# Patient Record
Sex: Female | Born: 1950 | Race: White | Hispanic: No | Smoking: Never smoker
Health system: Southern US, Community
[De-identification: ages and names within clinical notes are randomized; demographics above are authoritative.]

## PROBLEM LIST (undated history)

## (undated) DIAGNOSIS — E7800 Pure hypercholesterolemia, unspecified: Secondary | ICD-10-CM

## (undated) DIAGNOSIS — D122 Benign neoplasm of ascending colon: Secondary | ICD-10-CM

## (undated) DIAGNOSIS — M5442 Lumbago with sciatica, left side: Secondary | ICD-10-CM

## (undated) DIAGNOSIS — T8859XA Other complications of anesthesia, initial encounter: Secondary | ICD-10-CM

## (undated) DIAGNOSIS — Z8489 Family history of other specified conditions: Secondary | ICD-10-CM

## (undated) DIAGNOSIS — E785 Hyperlipidemia, unspecified: Secondary | ICD-10-CM

## (undated) DIAGNOSIS — M5136 Other intervertebral disc degeneration, lumbar region: Secondary | ICD-10-CM

## (undated) DIAGNOSIS — I10 Essential (primary) hypertension: Secondary | ICD-10-CM

## (undated) DIAGNOSIS — N8110 Cystocele, unspecified: Secondary | ICD-10-CM

## (undated) DIAGNOSIS — K8020 Calculus of gallbladder without cholecystitis without obstruction: Secondary | ICD-10-CM

## (undated) DIAGNOSIS — C801 Malignant (primary) neoplasm, unspecified: Secondary | ICD-10-CM

## (undated) DIAGNOSIS — N1831 Chronic kidney disease, stage 3a: Secondary | ICD-10-CM

## (undated) DIAGNOSIS — M51369 Other intervertebral disc degeneration, lumbar region without mention of lumbar back pain or lower extremity pain: Secondary | ICD-10-CM

## (undated) DIAGNOSIS — F439 Reaction to severe stress, unspecified: Secondary | ICD-10-CM

## (undated) DIAGNOSIS — N393 Stress incontinence (female) (male): Secondary | ICD-10-CM

## (undated) DIAGNOSIS — M1990 Unspecified osteoarthritis, unspecified site: Secondary | ICD-10-CM

## (undated) DIAGNOSIS — K5730 Diverticulosis of large intestine without perforation or abscess without bleeding: Secondary | ICD-10-CM

## (undated) DIAGNOSIS — D649 Anemia, unspecified: Secondary | ICD-10-CM

## (undated) DIAGNOSIS — M1612 Unilateral primary osteoarthritis, left hip: Secondary | ICD-10-CM

## (undated) DIAGNOSIS — M810 Age-related osteoporosis without current pathological fracture: Secondary | ICD-10-CM

## (undated) DIAGNOSIS — Z8719 Personal history of other diseases of the digestive system: Secondary | ICD-10-CM

## (undated) DIAGNOSIS — K219 Gastro-esophageal reflux disease without esophagitis: Secondary | ICD-10-CM

## (undated) DIAGNOSIS — K2270 Barrett's esophagus without dysplasia: Secondary | ICD-10-CM

## (undated) HISTORY — PX: CYSTOURETHROSCOPY: SHX476

## (undated) HISTORY — PX: TUBAL LIGATION: SHX77

## (undated) HISTORY — PX: OTHER SURGICAL HISTORY: SHX169

## (undated) HISTORY — PX: COLONOSCOPY: SHX174

## (undated) HISTORY — PX: JOINT REPLACEMENT: SHX530

## (undated) HISTORY — PX: VAGINAL HYSTERECTOMY: SUR661

---

## 2005-06-25 HISTORY — PX: BREAST SURGERY: SHX581

## 2007-06-26 DIAGNOSIS — C801 Malignant (primary) neoplasm, unspecified: Secondary | ICD-10-CM

## 2007-06-26 HISTORY — PX: MASTECTOMY: SHX3

## 2007-06-26 HISTORY — DX: Malignant (primary) neoplasm, unspecified: C80.1

## 2008-11-17 ENCOUNTER — Ambulatory Visit: Payer: Self-pay | Admitting: Internal Medicine

## 2011-06-25 ENCOUNTER — Ambulatory Visit: Payer: Self-pay | Admitting: Internal Medicine

## 2011-07-10 ENCOUNTER — Ambulatory Visit: Payer: Self-pay | Admitting: Internal Medicine

## 2011-07-10 LAB — CBC CANCER CENTER
Basophil %: 0.6 %
Lymphocyte %: 14 %
MCHC: 33.8 g/dL (ref 32.0–36.0)
Monocyte %: 5.7 %
Neutrophil #: 3.6 x10 3/mm (ref 1.4–6.5)
Neutrophil %: 78.5 %
Platelet: 215 x10 3/mm (ref 150–440)
RDW: 11.6 % (ref 11.5–14.5)
WBC: 4.7 x10 3/mm (ref 3.6–11.0)

## 2011-07-10 LAB — BILIRUBIN, TOTAL: Bilirubin,Total: 1.2 mg/dL — ABNORMAL HIGH (ref 0.2–1.0)

## 2011-07-10 LAB — LACTATE DEHYDROGENASE: LDH: 212 U/L (ref 84–246)

## 2011-07-10 LAB — BILIRUBIN, DIRECT: Bilirubin, Direct: 0.2 mg/dL (ref 0.00–0.20)

## 2011-07-11 LAB — IRON AND TIBC
Iron Bind.Cap.(Total): 332 ug/dL (ref 250–450)
Iron Saturation: 27 %
Iron: 89 ug/dL (ref 50–170)
Unbound Iron-Bind.Cap.: 243 ug/dL

## 2011-07-11 LAB — RETICULOCYTES
Absolute Retic Count: 0.051 10*6/uL (ref 0.024–0.084)
Reticulocyte: 1.3 % (ref 0.5–1.5)

## 2011-07-27 ENCOUNTER — Ambulatory Visit: Payer: Self-pay | Admitting: Internal Medicine

## 2011-07-31 LAB — LACTATE DEHYDROGENASE: LDH: 197 U/L (ref 84–246)

## 2011-07-31 LAB — OCCULT BLOOD X 1 CARD TO LAB, STOOL
Occult Blood, Feces: NEGATIVE
Occult Blood, Feces: NEGATIVE

## 2011-07-31 LAB — BILIRUBIN, DIRECT: Bilirubin, Direct: 0.1 mg/dL (ref 0.00–0.20)

## 2011-07-31 LAB — RETICULOCYTES
Absolute Retic Count: 0.13 10*6/uL — ABNORMAL HIGH (ref 0.024–0.084)
Reticulocyte: 3.4 % — ABNORMAL HIGH (ref 0.5–1.5)

## 2011-08-14 LAB — HEPATIC FUNCTION PANEL A (ARMC)
Albumin: 4.2 g/dL (ref 3.4–5.0)
Alkaline Phosphatase: 107 U/L (ref 50–136)
Bilirubin,Total: 1.1 mg/dL — ABNORMAL HIGH (ref 0.2–1.0)
SGOT(AST): 25 U/L (ref 15–37)
SGPT (ALT): 33 U/L

## 2011-08-14 LAB — IRON AND TIBC
Iron Bind.Cap.(Total): 332 ug/dL (ref 250–450)
Iron: 100 ug/dL (ref 50–170)
Unbound Iron-Bind.Cap.: 232 ug/dL

## 2011-08-14 LAB — RETICULOCYTES
Absolute Retic Count: 0.087 10*6/uL — ABNORMAL HIGH (ref 0.024–0.084)
Reticulocyte: 2.3 % — ABNORMAL HIGH (ref 0.5–1.5)

## 2011-08-14 LAB — CANCER CENTER HEMOGLOBIN: HGB: 11.6 g/dL — ABNORMAL LOW (ref 12.0–16.0)

## 2011-08-15 LAB — URINE IEP, RANDOM

## 2011-08-24 ENCOUNTER — Ambulatory Visit: Payer: Self-pay | Admitting: Internal Medicine

## 2011-08-28 LAB — RETICULOCYTES
Absolute Retic Count: 0.139 10*6/uL — ABNORMAL HIGH (ref 0.024–0.084)
Reticulocyte: 3.4 % — ABNORMAL HIGH (ref 0.5–1.5)

## 2011-08-28 LAB — BILIRUBIN, DIRECT: Bilirubin, Direct: 0.2 mg/dL (ref 0.00–0.20)

## 2011-09-11 LAB — CBC CANCER CENTER
Basophil #: 0 x10 3/mm (ref 0.0–0.1)
Basophil %: 0.9 %
Eosinophil #: 0.1 x10 3/mm (ref 0.0–0.7)
HGB: 12.1 g/dL (ref 12.0–16.0)
MCH: 30.8 pg (ref 26.0–34.0)
MCHC: 32.9 g/dL (ref 32.0–36.0)
MCV: 93.7 fL (ref 80–100)
Monocyte #: 0.3 x10 3/mm (ref 0.0–0.7)
Monocyte %: 7.1 %
Neutrophil #: 2.9 x10 3/mm (ref 1.4–6.5)
Neutrophil %: 69.6 %
RBC: 3.93 10*6/uL (ref 3.80–5.20)

## 2011-09-24 ENCOUNTER — Ambulatory Visit: Payer: Self-pay | Admitting: Internal Medicine

## 2011-10-22 LAB — CBC CANCER CENTER
Eosinophil #: 0.1 x10 3/mm (ref 0.0–0.7)
Eosinophil %: 2.6 %
HGB: 12.3 g/dL (ref 12.0–16.0)
Lymphocyte #: 0.9 x10 3/mm — ABNORMAL LOW (ref 1.0–3.6)
Monocyte #: 0.3 x10 3/mm (ref 0.2–0.9)
Neutrophil %: 73.2 %
Platelet: 193 x10 3/mm (ref 150–440)
WBC: 5.2 x10 3/mm (ref 3.6–11.0)

## 2011-10-22 LAB — IRON AND TIBC
Iron Bind.Cap.(Total): 339 ug/dL (ref 250–450)
Iron Saturation: 29 %
Iron: 97 ug/dL (ref 50–170)
Unbound Iron-Bind.Cap.: 242 ug/dL

## 2011-10-22 LAB — FERRITIN: Ferritin (ARMC): 58 ng/mL (ref 8–388)

## 2011-10-24 ENCOUNTER — Ambulatory Visit: Payer: Self-pay | Admitting: Internal Medicine

## 2011-10-29 LAB — OCCULT BLOOD X 1 CARD TO LAB, STOOL
Occult Blood, Feces: NEGATIVE
Occult Blood, Feces: NEGATIVE

## 2011-11-24 ENCOUNTER — Ambulatory Visit: Payer: Self-pay | Admitting: Internal Medicine

## 2011-12-04 ENCOUNTER — Ambulatory Visit: Payer: Self-pay | Admitting: Internal Medicine

## 2011-12-04 LAB — CBC CANCER CENTER
Eosinophil #: 0.1 x10 3/mm (ref 0.0–0.7)
HCT: 36.3 % (ref 35.0–47.0)
HGB: 12.1 g/dL (ref 12.0–16.0)
Lymphocyte %: 20.5 %
MCH: 31.6 pg (ref 26.0–34.0)
MCHC: 33.3 g/dL (ref 32.0–36.0)
MCV: 95 fL (ref 80–100)
Monocyte #: 0.3 x10 3/mm (ref 0.2–0.9)
Monocyte %: 7.8 %
Platelet: 201 x10 3/mm (ref 150–440)

## 2011-12-24 ENCOUNTER — Ambulatory Visit: Payer: Self-pay | Admitting: Internal Medicine

## 2012-06-27 ENCOUNTER — Ambulatory Visit: Payer: Self-pay | Admitting: Internal Medicine

## 2012-07-08 LAB — CBC CANCER CENTER
Basophil %: 1.2 %
Eosinophil #: 0.1 x10 3/mm (ref 0.0–0.7)
Eosinophil %: 2.1 %
HCT: 35.3 % (ref 35.0–47.0)
Lymphocyte #: 0.9 x10 3/mm — ABNORMAL LOW (ref 1.0–3.6)
Lymphocyte %: 20.8 %
MCHC: 33.6 g/dL (ref 32.0–36.0)
MCV: 92 fL (ref 80–100)
Monocyte #: 0.3 x10 3/mm (ref 0.2–0.9)
Monocyte %: 7 %
Neutrophil #: 2.9 x10 3/mm (ref 1.4–6.5)
Platelet: 184 x10 3/mm (ref 150–440)
RBC: 3.83 10*6/uL (ref 3.80–5.20)
RDW: 11.4 % — ABNORMAL LOW (ref 11.5–14.5)

## 2012-07-08 LAB — OCCULT BLOOD X 1 CARD TO LAB, STOOL
Occult Blood, Feces: NEGATIVE
Occult Blood, Feces: NEGATIVE
Occult Blood, Feces: NEGATIVE

## 2012-07-08 LAB — IRON AND TIBC
Iron Bind.Cap.(Total): 330 ug/dL (ref 250–450)
Iron Saturation: 34 %
Unbound Iron-Bind.Cap.: 219 ug/dL

## 2012-07-26 ENCOUNTER — Ambulatory Visit: Payer: Self-pay | Admitting: Internal Medicine

## 2013-12-04 DIAGNOSIS — M129 Arthropathy, unspecified: Secondary | ICD-10-CM | POA: Insufficient documentation

## 2013-12-04 DIAGNOSIS — D649 Anemia, unspecified: Secondary | ICD-10-CM | POA: Insufficient documentation

## 2013-12-04 DIAGNOSIS — E785 Hyperlipidemia, unspecified: Secondary | ICD-10-CM | POA: Insufficient documentation

## 2014-01-04 IMAGING — US ABDOMEN ULTRASOUND
1 series · 17 of 25 positions shown · non-contrast
Comparison: none

REASON FOR EXAM: Anemia
COMMENTS:

[Series 1: abdomen ultrasound · 17 of 84 slices shown]
[im 1/84]
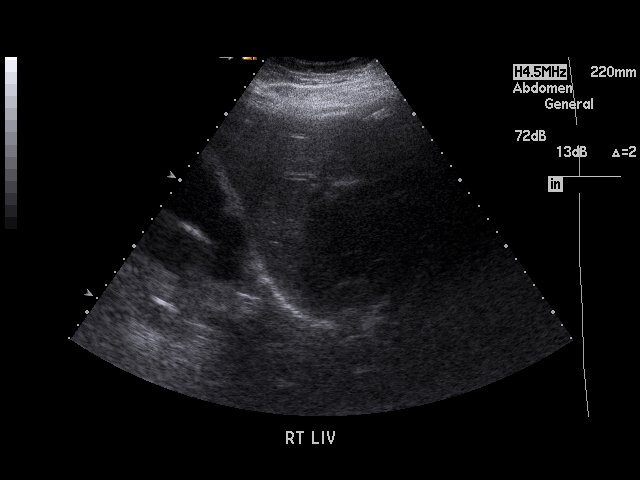
[im 7/84]
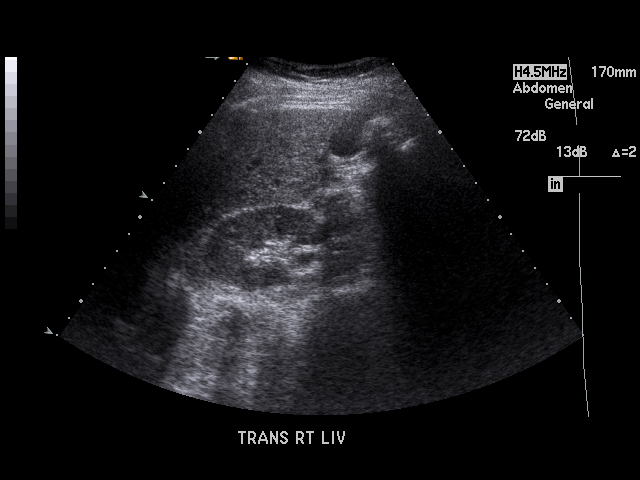
[im 11/84]
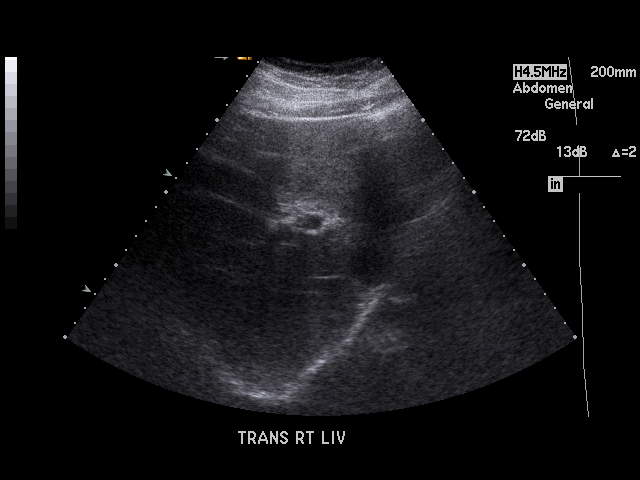
[im 18/84]
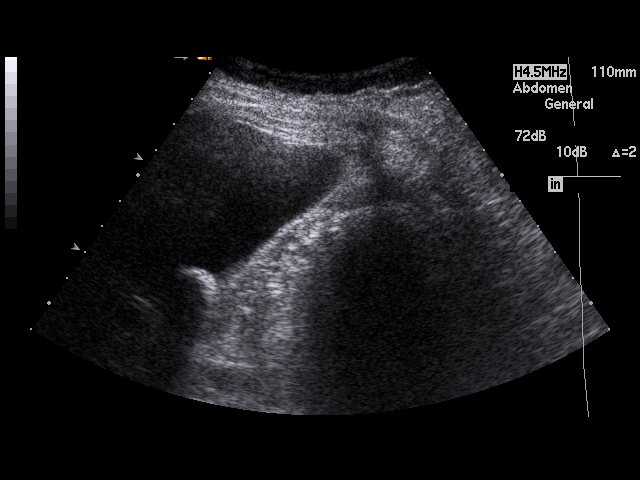
[im 21/84]
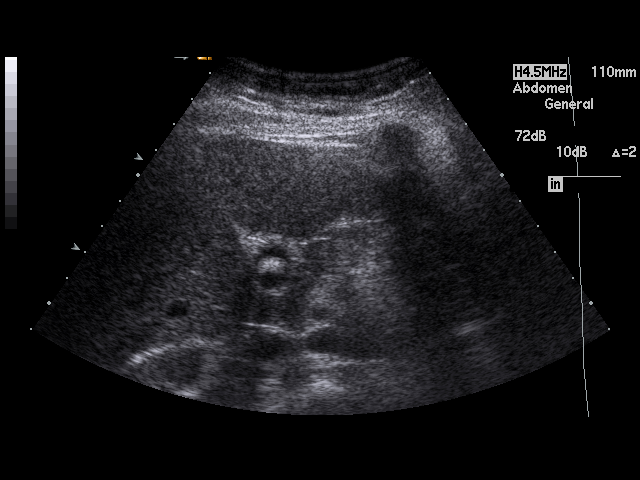
[im 28/84]
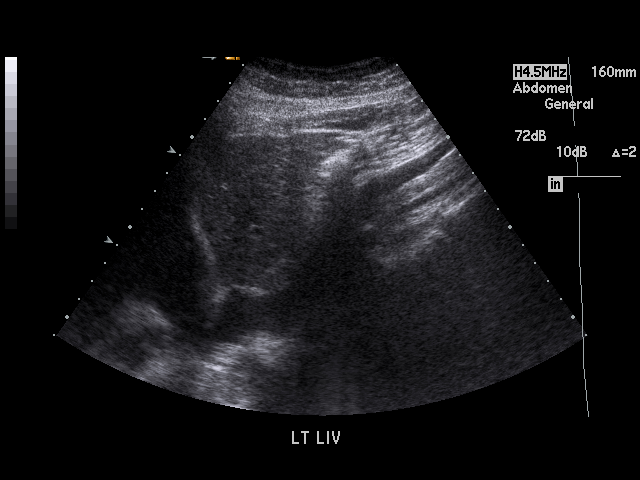
[im 32/84]
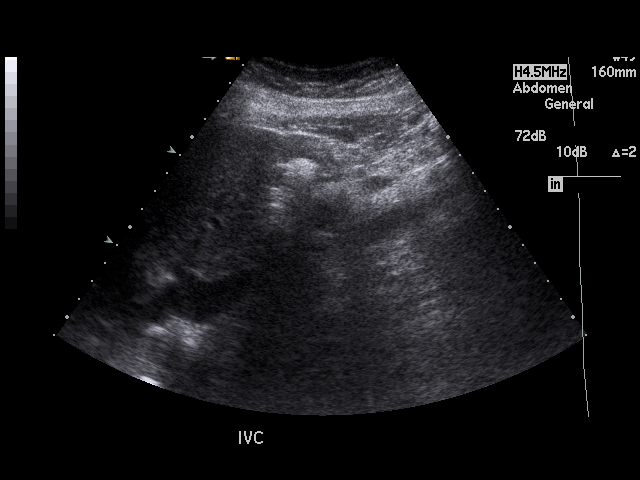
[im 39/84]
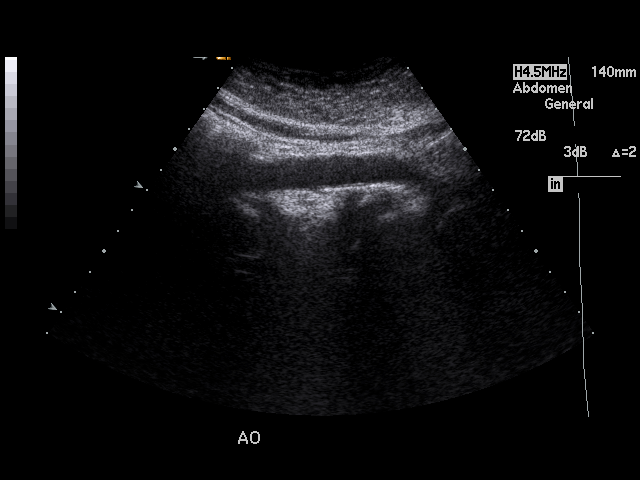
[im 42/84]
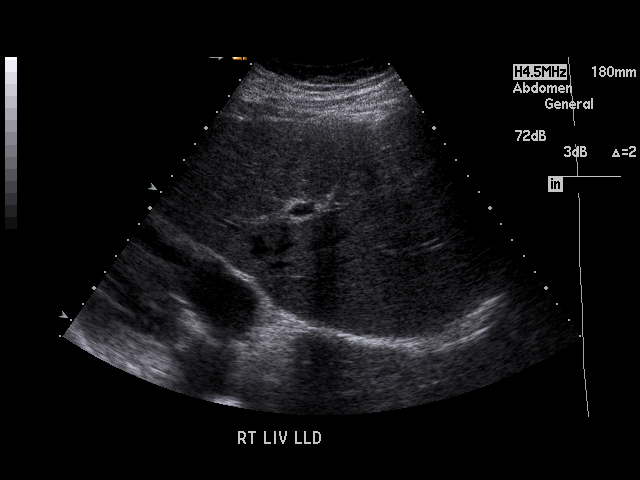
[im 45/84]
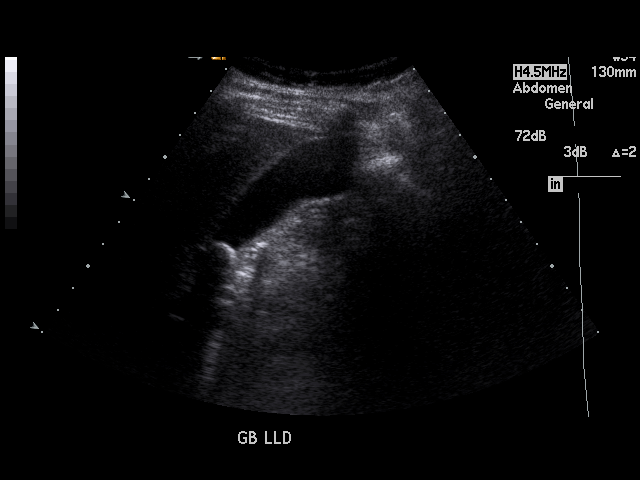
[im 52/84]
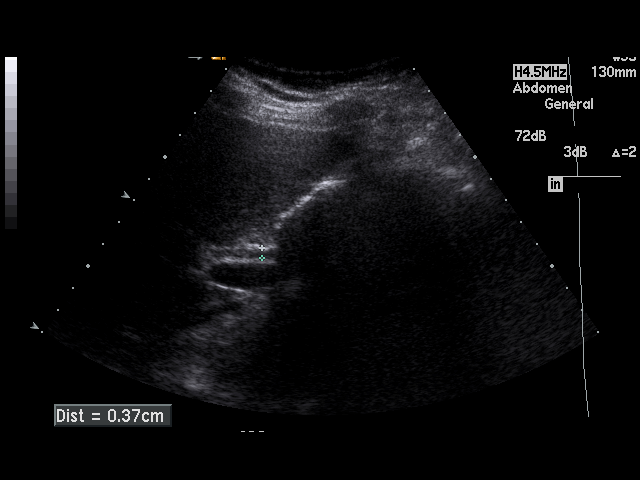
[im 56/84]
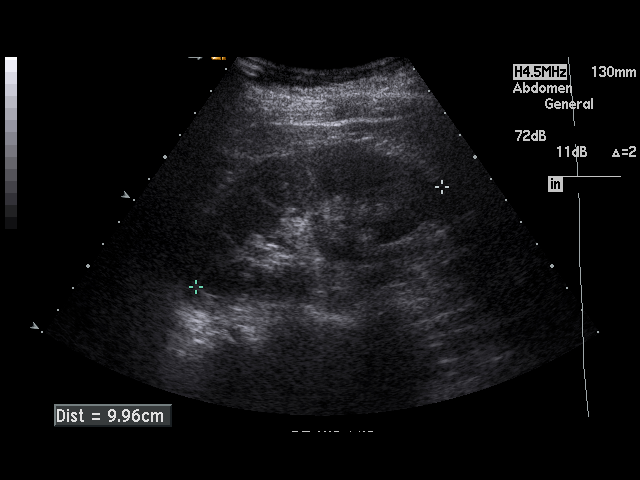
[im 63/84]
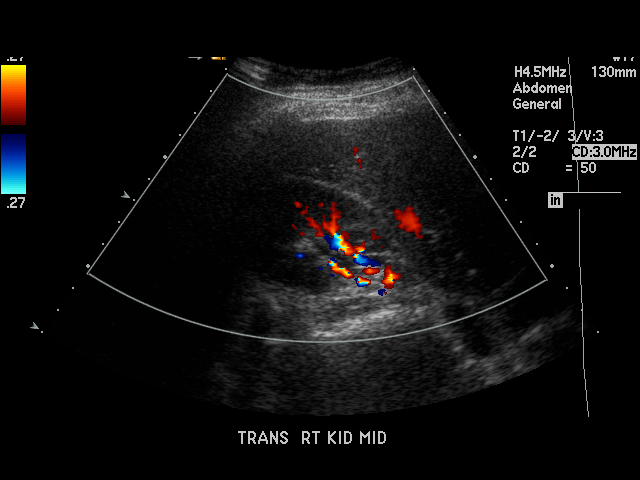
[im 66/84]
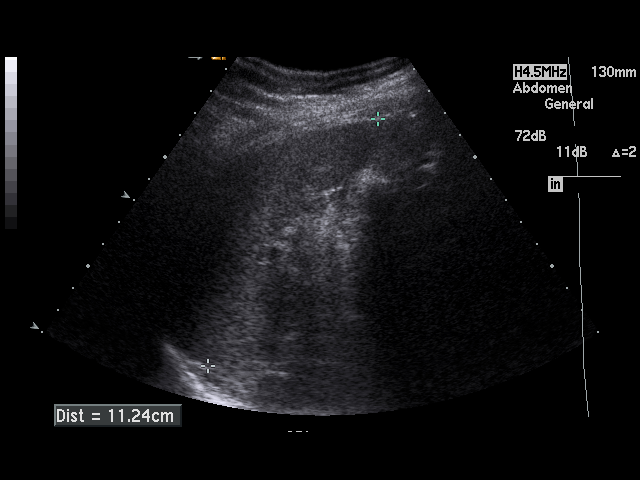
[im 73/84]
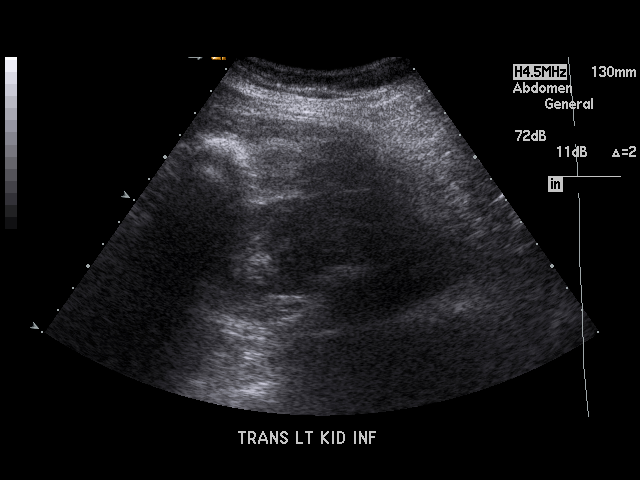
[im 77/84]
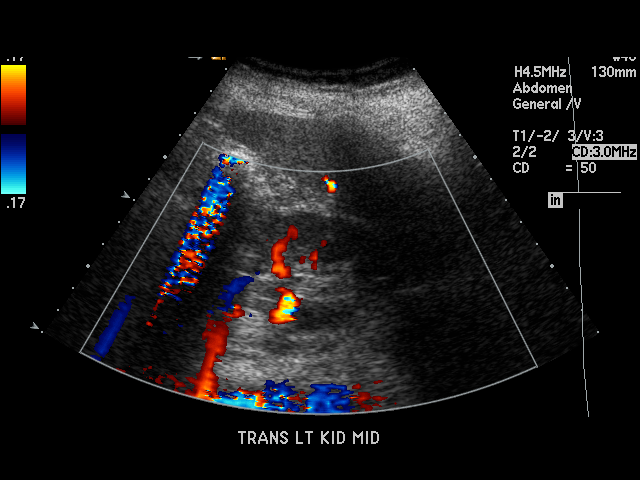
[im 84/84]
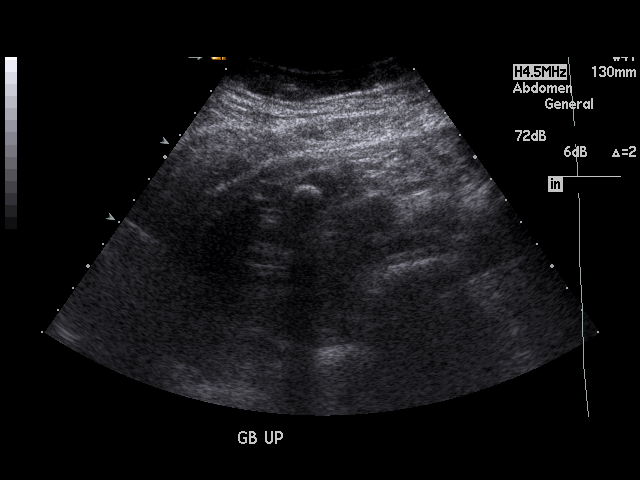

[17 of 25 positions shown; findings below may reference images not displayed]

PROCEDURE:     PADAM - PADAM ABDOMEN GENERAL SURVEY  - August 28, 2011  [DATE]

RESULT:     Abdominal sonogram is performed. Stones are present within the
gallbladder but appear to be mobile with changes in patient position. No
sonographic Murphy's sign is present. The gallbladder wall thickness is
normal at 1.9 mm. The common bile duct diameter is 3.7 mm. The hepatic
echotexture is normal. The liver is normal in size. No intrahepatic or
extrahepatic biliary ductal dilation is seen. The visualized pancreas is
unremarkable. The aorta, proximal inferior vena cava, kidneys and spleen
appear unremarkable.

Right kidney length is 9.96 cm. Left kidney length is 10.61 cm.
IMPRESSION: 1. Cholelithiasis.
2. No evidence of acute cholecystitis. No other significant abnormality
evident within the abdomen.

## 2017-07-09 ENCOUNTER — Emergency Department: Payer: Medicare Other

## 2017-07-09 ENCOUNTER — Emergency Department
Admission: EM | Admit: 2017-07-09 | Discharge: 2017-07-09 | Disposition: A | Discharge status: Home / Self Care | Payer: Medicare Other | Attending: Emergency Medicine | Admitting: Emergency Medicine

## 2017-07-09 DIAGNOSIS — K8020 Calculus of gallbladder without cholecystitis without obstruction: Secondary | ICD-10-CM | POA: Insufficient documentation

## 2017-07-09 DIAGNOSIS — N390 Urinary tract infection, site not specified: Secondary | ICD-10-CM | POA: Diagnosis not present

## 2017-07-09 DIAGNOSIS — K269 Duodenal ulcer, unspecified as acute or chronic, without hemorrhage or perforation: Secondary | ICD-10-CM | POA: Insufficient documentation

## 2017-07-09 DIAGNOSIS — I10 Essential (primary) hypertension: Secondary | ICD-10-CM | POA: Diagnosis not present

## 2017-07-09 DIAGNOSIS — R1012 Left upper quadrant pain: Secondary | ICD-10-CM | POA: Diagnosis present

## 2017-07-09 HISTORY — DX: Malignant (primary) neoplasm, unspecified: C80.1

## 2017-07-09 HISTORY — DX: Essential (primary) hypertension: I10

## 2017-07-09 LAB — BASIC METABOLIC PANEL
Anion gap: 11 (ref 5–15)
BUN: 34 mg/dL — ABNORMAL HIGH (ref 6–20)
CO2: 25 mmol/L (ref 22–32)
Calcium: 10 mg/dL (ref 8.9–10.3)
Chloride: 104 mmol/L (ref 101–111)
Creatinine, Ser: 1.25 mg/dL — ABNORMAL HIGH (ref 0.44–1.00)
GFR calc non Af Amer: 44 mL/min — ABNORMAL LOW (ref >60)
GFR, EST AFRICAN AMERICAN: 51 mL/min — AB (ref >60)
Glucose, Bld: 117 mg/dL — ABNORMAL HIGH (ref 65–99)
Potassium: 4.2 mmol/L (ref 3.5–5.1)
Sodium: 140 mmol/L (ref 135–145)

## 2017-07-09 LAB — URINALYSIS, COMPLETE (UACMP) WITH MICROSCOPIC
Bacteria, UA: NONE SEEN
GLUCOSE, UA: NEGATIVE mg/dL
Ketones, ur: 5 mg/dL — AB
NITRITE: NEGATIVE
PH: 5 (ref 5.0–8.0)
Protein, ur: 30 mg/dL — AB
SPECIFIC GRAVITY, URINE: 1.031 — AB (ref 1.005–1.030)

## 2017-07-09 LAB — CBC
HEMATOCRIT: 38.8 % (ref 35.0–47.0)
HEMOGLOBIN: 12.9 g/dL (ref 12.0–16.0)
MCH: 31.4 pg (ref 26.0–34.0)
MCHC: 33.3 g/dL (ref 32.0–36.0)
MCV: 94.4 fL (ref 80.0–100.0)
Platelets: 277 10*3/uL (ref 150–440)
RBC: 4.11 MIL/uL (ref 3.80–5.20)
RDW: 12 % (ref 11.5–14.5)
WBC: 12.1 10*3/uL — AB (ref 3.6–11.0)

## 2017-07-09 LAB — HEPATIC FUNCTION PANEL
ALK PHOS: 90 U/L (ref 38–126)
ALT: 24 U/L (ref 14–54)
AST: 32 U/L (ref 15–41)
Albumin: 4.6 g/dL (ref 3.5–5.0)
BILIRUBIN INDIRECT: 1 mg/dL — AB (ref 0.3–0.9)
BILIRUBIN TOTAL: 1.2 mg/dL (ref 0.3–1.2)
Bilirubin, Direct: 0.2 mg/dL (ref 0.1–0.5)
Total Protein: 7.8 g/dL (ref 6.5–8.1)

## 2017-07-09 LAB — TROPONIN I: Troponin I: <0.03 ng/mL (ref <0.03)

## 2017-07-09 LAB — LIPASE, BLOOD: LIPASE: 46 U/L (ref 11–51)

## 2017-07-09 MED ORDER — PANTOPRAZOLE SODIUM 40 MG PO TBEC: 40.0000 mg | DELAYED_RELEASE_TABLET | Freq: Every day | ORAL | 0 refills | Status: DC

## 2017-07-09 MED ORDER — PANTOPRAZOLE SODIUM 40 MG PO TBEC
40.0000 mg | DELAYED_RELEASE_TABLET | Freq: Once | ORAL | Status: AC
  Administered 2017-07-09: 40 mg via ORAL
  Filled 2017-07-09: qty 1

## 2017-07-09 MED ORDER — IOPAMIDOL (ISOVUE-300) INJECTION 61%
30.0000 mL | Freq: Once | INTRAVENOUS | Status: AC | PRN
  Administered 2017-07-09: 30 mL via ORAL

## 2017-07-09 MED ORDER — CEPHALEXIN 500 MG PO CAPS
500.0000 mg | ORAL_CAPSULE | Freq: Once | ORAL | Status: AC
  Administered 2017-07-09: 500 mg via ORAL
  Filled 2017-07-09: qty 1

## 2017-07-09 MED ORDER — SODIUM CHLORIDE 0.9 % IV BOLUS (SEPSIS)
1000.0000 mL | Freq: Once | INTRAVENOUS | Status: AC
  Administered 2017-07-09: 1000 mL via INTRAVENOUS

## 2017-07-09 MED ORDER — IOPAMIDOL (ISOVUE-300) INJECTION 61%
75.0000 mL | Freq: Once | INTRAVENOUS | Status: AC | PRN
  Administered 2017-07-09: 75 mL via INTRAVENOUS

## 2017-07-09 MED ORDER — ONDANSETRON 4 MG PO TBDP
4.0000 mg | ORAL_TABLET | Freq: Once | ORAL | Status: AC
  Administered 2017-07-09: 4 mg via ORAL
  Filled 2017-07-09: qty 1

## 2017-07-09 MED ORDER — CEPHALEXIN 500 MG PO CAPS: 500.0000 mg | ORAL_CAPSULE | Freq: Three times a day (TID) | ORAL | 0 refills | Status: AC

## 2017-07-09 NOTE — ED Provider Notes (Signed)
[facility] Emergency Department Provider Note  ____________________________________________   First MD Initiated Contact with Patient 07/09/17 [phone]     (approximate)  I have reviewed the triage vital signs and the nursing notes.   HISTORY  Chief Complaint Emesis and Abdominal Pain   HPI Debbie Chavez is a 67 y.o. female with a history of hypertension as well as breast cancer who is presenting to the emergency department today with diffuse abdominal pain.  Says the pain is a 4 out of 10 right now but when it started at about midnight it was an 8 to a 10 out of 10.  She says that it feels like a pressure and cramping but without any radiation.  Denies any burning with urination, urinary frequency or vaginal bleeding or discharge.  She says it is been associated with multiple episodes of vomiting which turned bilious towards the morning.  She says that she has a history of gallstones which were found incidentally during ultrasound remotely.  Denies any exacerbating or alleviating factors.  Denies any diarrhea.    Past Medical History:  Diagnosis Date  . Cancer ([place])   . Hypertension     There are no active problems to display for this patient.   Past Surgical History:  Procedure Laterality Date  . BILATERAL TOTAL MASTECTOMY WITH AXILLARY LYMPH NODE DISSECTION    . BREAST SURGERY    . COLONOSCOPY    . JOINT REPLACEMENT      Prior to Admission medications   Not on File    Allergies Demerol [meperidine]  No family history on file.  Social History Social History   Tobacco Use  . Smoking status: Never Smoker  . Smokeless tobacco: Never Used  Substance Use Topics  . Alcohol use: Yes  . Drug use: No    Review of Systems  Constitutional: No fever/chills Eyes: No visual changes. ENT: No sore throat. Cardiovascular: Denies chest pain. Respiratory: Denies shortness of breath. Gastrointestinal:   No diarrhea.  No  constipation. Genitourinary: Negative for dysuria. Musculoskeletal: Negative for back pain. Skin: Negative for rash. Neurological: Negative for headaches, focal weakness or numbness.   ____________________________________________   PHYSICAL EXAM:  VITAL SIGNS: ED Triage Vitals  Enc Vitals Group     BP 07/09/17 0624 120/78     Pulse Rate 07/09/17 0624 (!) 117     Resp 07/09/17 0624 20     Temp 07/09/17 0624 (!) 97.5 F (36.4 C)     Temp Source 07/09/17 0624 Oral     SpO2 07/09/17 0624 98 %     Weight 07/09/17 0630 165 lb (74.8 kg)     Height 07/09/17 0630 5\' 5"  (1.651 m)     Head Circumference --      Peak Flow --      Pain Score 07/09/17 0630 6     Pain Loc --      Pain Edu? --      Excl. in [place]? --     Constitutional: Alert and oriented. Well appearing and in no acute distress. Eyes: Conjunctivae are normal.  Head: Atraumatic. Nose: No congestion/rhinnorhea. Mouth/Throat: Mucous membranes are moist.  Neck: No stridor.   Cardiovascular: Normal rate, regular rhythm. Grossly normal heart sounds.  Good peripheral circulation. Respiratory: Normal respiratory effort.  No retractions. Lungs CTAB. Gastrointestinal: Soft with suprapubic tenderness to palpation which is mild to moderate.  No right upper quadrant tenderness to palpation.  Negative Murphy sign. No distention. No CVA tenderness.  Musculoskeletal: No lower extremity tenderness nor edema.  No joint effusions. Neurologic:  Normal speech and language. No gross focal neurologic deficits are appreciated. Skin:  Skin is warm, dry and intact. No rash noted. Psychiatric: Mood and affect are normal. Speech and behavior are normal.  ____________________________________________   LABS (all labs ordered are listed, but only abnormal results are displayed)  Labs Reviewed  BASIC METABOLIC PANEL - Abnormal; Notable for the following components:      Result Value   Glucose, Bld 117 (*)    BUN 34 (*)    Creatinine, Ser 1.25  (*)    GFR calc non Af Amer 44 (*)    GFR calc Af Amer 51 (*)    All other components within normal limits  CBC - Abnormal; Notable for the following components:   WBC 12.1 (*)    All other components within normal limits  URINALYSIS, COMPLETE (UACMP) WITH MICROSCOPIC - Abnormal; Notable for the following components:   Color, Urine AMBER (*)    APPearance CLOUDY (*)    Specific Gravity, Urine 1.031 (*)    Hgb urine dipstick SMALL (*)    Bilirubin Urine SMALL (*)    Ketones, ur 5 (*)    Protein, ur 30 (*)    Leukocytes, UA LARGE (*)    Squamous Epithelial / LPF 6-30 (*)    Non Squamous Epithelial 0-5 (*)    All other components within normal limits  HEPATIC FUNCTION PANEL - Abnormal; Notable for the following components:   Indirect Bilirubin 1.0 (*)    All other components within normal limits  TROPONIN I  LIPASE, BLOOD   ____________________________________________  EKG  ED ECG REPORT I, Doran Stabler, the attending physician, personally viewed and interpreted this ECG.   Date: 07/09/2017  EKG Time: 0628  Rate: 105  Rhythm: sinus tachycardia  Axis: Normal  Intervals:none  ST&T Change: No ST segment elevation or depression.  No abnormal T wave inversion.  Abnormal EKG reading likely secondary to wandering baseline.  ____________________________________________  RADIOLOGY  No active disease on the chest x-ray.  CT exam with thickened appearance of the second portion of the duodenum with a tiny amount of gas proximal in the duodenum may represent a diverticulum or ulcer.  8 cm gallstone in the gallbladder.  No pericholecystic fluid. ____________________________________________   PROCEDURES  Procedure(s) performed:   Procedures  Critical Care performed:   ____________________________________________   INITIAL IMPRESSION / ASSESSMENT AND PLAN / ED COURSE  Pertinent labs & imaging results that were available during my care of the patient were reviewed by me  and considered in my medical decision making (see chart for details).  Differential diagnosis includes, but is not limited to, ovarian cyst, ovarian torsion, acute appendicitis, diverticulitis, urinary tract infection/pyelonephritis, endometriosis, bowel obstruction, colitis, renal colic, gastroenteritis, hernia, fibroids, endometriosis, pregnancy related pain including ectopic pregnancy, etc. As part of my medical decision making, I reviewed the following data within the electronic MEDICAL RECORD NUMBER Notes from prior ED visits  ----------------------------------------- 1:34 PM on 07/09/2017 -----------------------------------------  Patient at this time without complaints of pain.  Reexamined her abdomen and there is no lower abdominal tenderness.  Minimal left upper quadrant abdominal tenderness which makes sense with her amount of vomiting that she had done overnight.  Negative Murphy sign.  I discussed her CAT scan with Dr. Burt Knack who does not see any extraluminal gas and believes the patient would benefit from being on a PPI and following up with gastroenterology.  I discussed the results of the CAT scan with the patient as well as her husband.  They are understanding of the plan to follow-up as an outpatient willing to comply.  Patient will be started on pantoprazole.      ____________________________________________   FINAL CLINICAL IMPRESSION(S) / ED DIAGNOSES  Cholelithiasis.  UTI.  Duodenal ulcer.    NEW MEDICATIONS STARTED DURING THIS VISIT:  New Prescriptions   No medications on file     Note:  This document was prepared using Dragon voice recognition software and may include unintentional dictation errors.     Orbie Pyo, MD 07/09/17 [phone]

## 2017-07-09 NOTE — ED Notes (Signed)
MD notified results are back.

## 2017-07-09 NOTE — ED Triage Notes (Signed)
Patient c/o multiple emeses and epigastric pain.

## 2017-07-11 LAB — URINE CULTURE

## 2017-07-18 DIAGNOSIS — M1711 Unilateral primary osteoarthritis, right knee: Secondary | ICD-10-CM | POA: Insufficient documentation

## 2017-07-19 ENCOUNTER — Other Ambulatory Visit: Payer: Self-pay

## 2017-07-19 ENCOUNTER — Telehealth: Payer: Self-pay | Admitting: Gastroenterology

## 2017-07-19 ENCOUNTER — Encounter: Payer: Self-pay | Admitting: Gastroenterology

## 2017-07-19 ENCOUNTER — Ambulatory Visit (INDEPENDENT_AMBULATORY_CARE_PROVIDER_SITE_OTHER): Payer: Medicare Other | Admitting: Gastroenterology

## 2017-07-19 VITALS — BP 118/73 | HR 65 | Wt 162.6 lb

## 2017-07-19 DIAGNOSIS — I10 Essential (primary) hypertension: Secondary | ICD-10-CM | POA: Insufficient documentation

## 2017-07-19 DIAGNOSIS — K253 Acute gastric ulcer without hemorrhage or perforation: Secondary | ICD-10-CM | POA: Diagnosis not present

## 2017-07-19 DIAGNOSIS — R1013 Epigastric pain: Secondary | ICD-10-CM

## 2017-07-19 DIAGNOSIS — R933 Abnormal findings on diagnostic imaging of other parts of digestive tract: Secondary | ICD-10-CM

## 2017-07-19 DIAGNOSIS — R9389 Abnormal findings on diagnostic imaging of other specified body structures: Secondary | ICD-10-CM

## 2017-07-19 DIAGNOSIS — Z1211 Encounter for screening for malignant neoplasm of colon: Secondary | ICD-10-CM

## 2017-07-19 DIAGNOSIS — C50919 Malignant neoplasm of unspecified site of unspecified female breast: Secondary | ICD-10-CM | POA: Insufficient documentation

## 2017-07-19 NOTE — Telephone Encounter (Signed)
Patient needs to r/s her procedure on 2/12. Interested in the 26th.

## 2017-07-19 NOTE — Progress Notes (Signed)
[name] [place], [place] [address]  Main: [phone]  Fax: [phone]   Gastroenterology Consultation  Referring Provider:     Dr. Ellison Hughs Primary Care Physician:  Sofie Hartigan, MD Primary Gastroenterologist:  Dr. [name] Reason for Consultation:     Abdominal pain, abnormal CT        HPI:   Debbie Chavez is a 67 y.o. y/o female referred for consultation & management  by Dr. Ellison Hughs, Chrissie Noa, MD.  Patient went to the ER on January 15 as she woke up in the middle of the night around midnight with severe, sharp, 10/10, midepigastric pain that lasted for 6-10 hours.  She had multiple episodes of vomiting, with no blood, but consisted of the food that she had eaten the night before.  She ate chili for dinner, but that is not abnormal for her and she has been able to tolerate chili before without any problems.'s she denies any altered bowel habits around the episode and reports 1-2 soft, regular bowel movements daily.  Her abdominal pain resolved within 1 day, and she does not have any abdominal pain at this time.  She has been tolerating oral diet and eating her regular diet without any problems since that day.  She denies any fever chills.  She reports taking diclofenac pill every day for years, and stopped taking it after ER visit.  She was given Protonix once daily to take after the ER visit which she has been taking.  CT scan on the ER admission showed thickened appearance of the second portion of the duodenum raising possibility of duodenitis/ulcer.  Tiny amount of gas proximal duodenum may represent diverticulum or ulcer.  No inflammation is around the appendix.  1.8 cm gallstone was reported.  No pericholecystic fluid.  No pancreatic mass or inflammation.  CBC on that visit did not show any anemia, and showed a hemoglobin of of 12.9.  Mildly elevated white count to 12.1.  Normal liver enzymes.  Lipase was normal.  Previous  colonoscopy, according to the patient was over 10 years ago and was normal.  No previous history of upper endoscopy.  No family history of colon cancer  Past Medical History:  Diagnosis Date  . Cancer ([place])   . Hypertension     Past Surgical History:  Procedure Laterality Date  . BILATERAL TOTAL MASTECTOMY WITH AXILLARY LYMPH NODE DISSECTION    . BREAST SURGERY    . COLONOSCOPY    . JOINT REPLACEMENT      Prior to Admission medications   Medication Sig Start Date End Date Taking? Authorizing Provider  lisinopril (PRINIVIL,ZESTRIL) 10 MG tablet  06/03/17  Yes [provider]  pantoprazole (PROTONIX) 40 MG tablet Take 1 tablet (40 mg total) by mouth daily. 07/09/17 07/09/18 Yes Schaevitz, Randall An, MD  traMADol Veatrice Bourbon) 50 MG tablet Take by mouth. 03/25/17  Yes [provider]  cephALEXin (KEFLEX) 500 MG capsule Take 1 capsule (500 mg total) by mouth 3 (three) times daily for 10 days. Patient not taking: Reported on 07/19/2017 07/09/17 07/19/17  Orbie Pyo, MD  diclofenac (VOLTAREN) 75 MG EC tablet  05/20/17   [provider]    Family History  Problem Relation Age of Onset  . Cancer Mother        breast  . Cancer Sister        breast     Social History   Tobacco Use  . Smoking  status: Never Smoker  . Smokeless tobacco: Never Used  Substance Use Topics  . Alcohol use: Yes  . Drug use: No    Allergies as of 07/19/2017 - Review Complete 07/19/2017  Allergen Reaction Noted  . Meperidine Other (See Comments) 07/09/2017    Review of Systems:    All systems reviewed and negative except where noted in HPI.   Physical Exam:  BP 118/73   Pulse 65   Wt 162 lb 9.6 oz (73.8 kg)   BMI 27.06 kg/m  No LMP recorded. Patient is postmenopausal. Psych:  Alert and cooperative. Normal mood and affect. General:   Alert,  Well-developed, well-nourished, pleasant and cooperative in NAD Head:  Normocephalic and atraumatic. Eyes:  Sclera  clear, no icterus.   Conjunctiva pink. Ears:  Normal auditory acuity. Nose:  No deformity, discharge, or lesions. Mouth:  No deformity or lesions,oropharynx pink & moist. Neck:  Supple; no masses or thyromegaly. Lungs:  Respirations even and unlabored.  Clear throughout to auscultation.   No wheezes, crackles, or rhonchi. No acute distress. Heart:  Regular rate and rhythm; no murmurs, clicks, rubs, or gallops. Abdomen:  Normal bowel sounds.  No bruits.  Soft, non-tender and non-distended without masses, hepatosplenomegaly or hernias noted.  No guarding or rebound tenderness.    Msk:  Symmetrical without gross deformities. Good, equal movement & strength bilaterally. Pulses:  Normal pulses noted. Extremities:  No clubbing or edema.  No cyanosis. Neurologic:  Alert and oriented x3;  grossly normal neurologically. Skin:  Intact without significant lesions or rashes. No jaundice. Lymph Nodes:  No significant cervical adenopathy. Psych:  Alert and cooperative. Normal mood and affect.   Labs: CBC    Component Value Date/Time   WBC 12.1 (H) 07/09/2017 0631   RBC 4.11 07/09/2017 0631   HGB 12.9 07/09/2017 0631   HGB 11.9 (L) 07/08/2012 0913   HCT 38.8 07/09/2017 0631   HCT 35.3 07/08/2012 0913   PLT 277 07/09/2017 0631   PLT 184 07/08/2012 0913   MCV 94.4 07/09/2017 0631   MCV 92 07/08/2012 0913   MCH 31.4 07/09/2017 0631   MCHC 33.3 07/09/2017 0631   RDW 12.0 07/09/2017 0631   RDW 11.4 (L) 07/08/2012 0913   LYMPHSABS 0.9 (L) 07/08/2012 0913   MONOABS 0.3 07/08/2012 0913   EOSABS 0.1 07/08/2012 0913   BASOSABS 0.1 07/08/2012 0913   CMP     Component Value Date/Time   NA 140 07/09/2017 0631   K 4.2 07/09/2017 0631   CL 104 07/09/2017 0631   CO2 25 07/09/2017 0631   GLUCOSE 117 (H) 07/09/2017 0631   BUN 34 (H) 07/09/2017 0631   CREATININE 1.25 (H) 07/09/2017 0631   CREATININE 1.08 08/14/2011 0932   CALCIUM 10.0 07/09/2017 0631   PROT 7.8 07/09/2017 0631   PROT 7.1  08/14/2011 0932   ALBUMIN 4.6 07/09/2017 0631   ALBUMIN 4.2 08/14/2011 0932   AST 32 07/09/2017 0631   AST 25 08/14/2011 0932   ALT 24 07/09/2017 0631   ALT 33 08/14/2011 0932   ALKPHOS 90 07/09/2017 0631   ALKPHOS 107 08/14/2011 0932   BILITOT 1.2 07/09/2017 0631   BILITOT 1.0 08/28/2011 0833   GFRNONAA 44 (L) 07/09/2017 0631   GFRNONAA 55 (L) 08/14/2011 0932   GFRAA 51 (L) 07/09/2017 0631   GFRAA >60 08/14/2011 0932    Imaging Studies: Dg Chest 2 View  Result Date: 07/09/2017 CLINICAL DATA:  Vomiting. EXAM: CHEST  2 VIEW COMPARISON:  Radiographs of August 28, 2011. FINDINGS: The heart size and mediastinal contours are within normal limits. Both lungs are clear. No pneumothorax or pleural effusion is noted. The visualized skeletal structures are unremarkable. IMPRESSION: No active cardiopulmonary disease. Electronically Signed   By: Marijo Conception, M.D.   On: 07/09/2017 07:28   Ct Abdomen Pelvis W Contrast  Result Date: 07/09/2017 CLINICAL DATA:  67 year old female with multiple episodes of emesis and epigastric pain. Breast cancer post surgery. Initial encounter. EXAM: CT ABDOMEN AND PELVIS WITH CONTRAST TECHNIQUE: Multidetector CT imaging of the abdomen and pelvis was performed using the standard protocol following bolus administration of intravenous contrast. CONTRAST:  44mL ISOVUE-300 IOPAMIDOL (ISOVUE-300) INJECTION 61% COMPARISON:  08/28/2011 ultrasound. FINDINGS: Exam is motion degraded. Lower chest: Scarring/atelectasis lung bases. Bilateral breast implants in place. Heart size within normal limits. Hepatobiliary: No worrisome hepatic lesion. Mild fatty infiltration. 1.8 cm gallstone. Proximal to this level, gallbladder is contracted. No pericholecystic fluid identified. Pancreas: No pancreatic mass or inflammation. Spleen: No splenic mass or enlargement Adrenals/Urinary Tract: No obstructing stone or hydronephrosis. Extrarenal pelvis configuration bilaterally. Slightly rotated right  kidney. Probable small renal cysts without worrisome renal or adrenal lesion. Noncontrast filled views of the urinary bladder without gross abnormality. Stomach/Bowel: Thickened appearance of the second portion of the duodenum raising possibility of duodenitis/ulcer. Tiny amount of gas proximal duodenum may represent diverticulum or ulcer. No inflammation surrounds the appendix. Under distended portions of the colon without extraluminal bowel inflammatory process centered at the level of colon identified Vascular/Lymphatic: Atherosclerotic changes aorta without evidence of abdominal aortic aneurysm or large vessel occlusion. No adenopathy. Reproductive: Pessary in place.  No worrisome adnexal lesion. Other: Trace amount of free fluid in the pelvis. No free intraperitoneal air. Musculoskeletal: Degenerative changes lower thoracic and lumbar spine without osseous destructive lesion. IMPRESSION: Exam is motion degraded. Thickened appearance of the second portion of the duodenum raising possibility of duodenitis/ulcer. Tiny amount of gas proximal duodenum may represent diverticulum or ulcer. No inflammation surrounds the appendix. Trace free fluid in pelvis. 8 cm gallstone. Proximal to this level, gallbladder is contracted. No pericholecystic fluid identified. If primary gallbladder abnormality were of high clinical concern ultrasound could be obtained. Aortic Atherosclerosis (ICD10-I70.0). Pessary in place. Electronically Signed   By: Genia Del M.D.   On: 07/09/2017 11:46    Assessment and Plan:   Debbie Chavez is a 67 y.o. y/o female has been referred for episode of epigastric pain that occurred on the 15th and resolved in the ER, with CT showing duodenal thickening in the second portion, and tiny amount of gas proximal duodenum representing diverticulum or ulcer.  Patient's pain has completely resolved at this time, and she has been tolerating an oral diet at home without difficulty since the 16th  I  have discussed the radiology report with the radiologist, and specifically asked if there is any extraluminal air or signs of perforation on the CT.  The radiologist stated that there are there is no perforation on the CT, and the gas pockets reported in the duodenum may be normal air, within the duodenum within of the duodenum, or due to or diverticulum or ulcer.  He specifically stated there is no extraluminal air.  In addition, patient has no abdominal pain, her abdominal exam is completely benign, and she has no clinical evidence of perforation either.  Her ER note, states that the CT and the patient was discussed with Dr. Burt Knack from surgery, "who does not see any extraluminal gas and believe the  patient would benefit from being on a PPI and following up with gastroenterology."  Her CT findings warrants endoscopic visualization to evaluate for any underlying ulcers.  She will continue to avoid NSAIDs as she has since the ER visit.  She will continue her Protonix. (Risks of PPI use were discussed with patient including bone loss, C. Diff diarrhea, pneumonia, infections, CKD, electrolyte abnormalities. Pt. Verbalizes understanding and chooses to continue the medication.)  Benefits of PPI use, at this time would outweigh the risks given the possibility of duodenal ulcer on her CT imaging.  No signs of active GI bleeding, abdominal pain, or alarm symptoms present to indicate emergent EGD at this time. We will schedule for elective EGD. if alarm symptoms above or any other signs or symptoms of concern develop, patient is to go to the ER, and she verbalized understanding of this.  Patient is also due for her screening colonoscopy I discussed with the patient that we can do both procedures together, or elect to do the EGD first and then the colonoscopy later. patient prefers to do both together, as she thinks she will be able to tolerate the prep just fine, since she has been tolerating oral food without  difficulty.  In addition, history of iron deficiency as mentioned in her chart previously.  She takes oral iron over-the-counter.  Upper and lower endoscopy will allow [place] to evaluate the etiology of her iron deficiency as well.  Her lowest ferritin level was 17 in 2017.  Her iron level is very normal at 76, last checked in 2017.  Small bowel capsule can be considered if upper and lower endoscopy are negative.   I have discussed alternative options, risks & benefits of upper and lower endoscopy,  which include, but are not limited to, bleeding, infection, perforation,respiratory complication & drug reaction.  The patient agrees with this plan & written consent will be obtained.    Dr [name]

## 2017-08-14 ENCOUNTER — Other Ambulatory Visit: Payer: Self-pay

## 2017-08-14 ENCOUNTER — Encounter: Payer: Self-pay | Admitting: *Deleted

## 2017-08-19 NOTE — Discharge Instructions (Signed)
General Anesthesia, Adult, Care After °These instructions provide you with information about caring for yourself after your procedure. Your health care provider may also give you more specific instructions. Your treatment has been planned according to current medical practices, but problems sometimes occur. Call your health care provider if you have any problems or questions after your procedure. °What can I expect after the procedure? °After the procedure, it is common to have: °· Vomiting. °· A sore throat. °· Mental slowness. ° °It is common to feel: °· Nauseous. °· Cold or shivery. °· Sleepy. °· Tired. °· Sore or achy, even in parts of your body where you did not have surgery. ° °Follow these instructions at home: °For at least 24 hours after the procedure: °· Do not: °? Participate in activities where you could fall or become injured. °? Drive. °? Use heavy machinery. °? Drink alcohol. °? Take sleeping pills or medicines that cause drowsiness. °? Make important decisions or sign legal documents. °? Take care of children on your own. °· Rest. °Eating and drinking °· If you vomit, drink water, juice, or soup when you can drink without vomiting. °· Drink enough fluid to keep your urine clear or pale yellow. °· Make sure you have little or no nausea before eating solid foods. °· Follow the diet recommended by your health care provider. °General instructions °· Have a responsible adult stay with you until you are awake and alert. °· Return to your normal activities as told by your health care provider. Ask your health care provider what activities are safe for you. °· Take over-the-counter and prescription medicines only as told by your health care provider. °· If you smoke, do not smoke without supervision. °· Keep all follow-up visits as told by your health care provider. This is important. °Contact a health care provider if: °· You continue to have nausea or vomiting at home, and medicines are not helpful. °· You  cannot drink fluids or start eating again. °· You cannot urinate after 8-12 hours. °· You develop a skin rash. °· You have fever. °· You have increasing redness at the site of your procedure. °Get help right away if: °· You have difficulty breathing. °· You have chest pain. °· You have unexpected bleeding. °· You feel that you are having a life-threatening or urgent problem. °This information is not intended to replace advice given to you by your health care provider. Make sure you discuss any questions you have with your health care provider. °Document Released: 09/17/2000 Document Revised: 11/14/2015 Document Reviewed: 05/26/2015 °[employer]vier Interactive Patient Education © 2018 [employer] ° °

## 2017-08-20 ENCOUNTER — Encounter
Admission: RE | Disposition: A | Discharge status: Home / Self Care | Payer: Self-pay | Source: Ambulatory Visit | Attending: Gastroenterology

## 2017-08-20 ENCOUNTER — Ambulatory Visit: Payer: Medicare Other | Admitting: Anesthesiology

## 2017-08-20 ENCOUNTER — Ambulatory Visit
Admission: RE | Admit: 2017-08-20 | Discharge: 2017-08-20 | Disposition: A | Discharge status: Home / Self Care | Payer: Medicare Other | Source: Ambulatory Visit | Attending: Gastroenterology | Admitting: Gastroenterology

## 2017-08-20 DIAGNOSIS — K2270 Barrett's esophagus without dysplasia: Secondary | ICD-10-CM

## 2017-08-20 DIAGNOSIS — M19042 Primary osteoarthritis, left hand: Secondary | ICD-10-CM | POA: Diagnosis not present

## 2017-08-20 DIAGNOSIS — K449 Diaphragmatic hernia without obstruction or gangrene: Secondary | ICD-10-CM | POA: Diagnosis not present

## 2017-08-20 DIAGNOSIS — R9389 Abnormal findings on diagnostic imaging of other specified body structures: Secondary | ICD-10-CM

## 2017-08-20 DIAGNOSIS — K5730 Diverticulosis of large intestine without perforation or abscess without bleeding: Secondary | ICD-10-CM

## 2017-08-20 DIAGNOSIS — Z1211 Encounter for screening for malignant neoplasm of colon: Secondary | ICD-10-CM

## 2017-08-20 DIAGNOSIS — K219 Gastro-esophageal reflux disease without esophagitis: Secondary | ICD-10-CM | POA: Insufficient documentation

## 2017-08-20 DIAGNOSIS — D122 Benign neoplasm of ascending colon: Secondary | ICD-10-CM | POA: Diagnosis not present

## 2017-08-20 DIAGNOSIS — K6389 Other specified diseases of intestine: Secondary | ICD-10-CM | POA: Diagnosis not present

## 2017-08-20 DIAGNOSIS — M19041 Primary osteoarthritis, right hand: Secondary | ICD-10-CM | POA: Insufficient documentation

## 2017-08-20 DIAGNOSIS — M170 Bilateral primary osteoarthritis of knee: Secondary | ICD-10-CM | POA: Diagnosis not present

## 2017-08-20 DIAGNOSIS — Z859 Personal history of malignant neoplasm, unspecified: Secondary | ICD-10-CM | POA: Insufficient documentation

## 2017-08-20 DIAGNOSIS — Z79899 Other long term (current) drug therapy: Secondary | ICD-10-CM | POA: Diagnosis not present

## 2017-08-20 DIAGNOSIS — K2950 Unspecified chronic gastritis without bleeding: Secondary | ICD-10-CM | POA: Insufficient documentation

## 2017-08-20 DIAGNOSIS — D509 Iron deficiency anemia, unspecified: Secondary | ICD-10-CM | POA: Diagnosis not present

## 2017-08-20 DIAGNOSIS — K635 Polyp of colon: Secondary | ICD-10-CM | POA: Diagnosis not present

## 2017-08-20 DIAGNOSIS — I10 Essential (primary) hypertension: Secondary | ICD-10-CM | POA: Insufficient documentation

## 2017-08-20 DIAGNOSIS — K3189 Other diseases of stomach and duodenum: Secondary | ICD-10-CM | POA: Diagnosis not present

## 2017-08-20 DIAGNOSIS — Z888 Allergy status to other drugs, medicaments and biological substances status: Secondary | ICD-10-CM | POA: Diagnosis not present

## 2017-08-20 DIAGNOSIS — D500 Iron deficiency anemia secondary to blood loss (chronic): Secondary | ICD-10-CM

## 2017-08-20 DIAGNOSIS — R933 Abnormal findings on diagnostic imaging of other parts of digestive tract: Secondary | ICD-10-CM | POA: Diagnosis not present

## 2017-08-20 DIAGNOSIS — K2980 Duodenitis without bleeding: Secondary | ICD-10-CM | POA: Diagnosis not present

## 2017-08-20 DIAGNOSIS — K228 Other specified diseases of esophagus: Secondary | ICD-10-CM | POA: Diagnosis not present

## 2017-08-20 HISTORY — DX: Anemia, unspecified: D64.9

## 2017-08-20 HISTORY — DX: Unspecified osteoarthritis, unspecified site: M19.90

## 2017-08-20 HISTORY — PX: ESOPHAGOGASTRODUODENOSCOPY (EGD) WITH PROPOFOL: SHX5813

## 2017-08-20 HISTORY — PX: COLONOSCOPY WITH PROPOFOL: SHX5780

## 2017-08-20 HISTORY — PX: POLYPECTOMY: SHX5525

## 2017-08-20 SURGERY — COLONOSCOPY WITH PROPOFOL
Anesthesia: General | Wound class: Clean Contaminated

## 2017-08-20 MED ORDER — ACETAMINOPHEN 160 MG/5ML PO SOLN
325.0000 mg | Freq: Once | ORAL | Status: DC
Start: 2017-08-20 — End: 2017-08-20

## 2017-08-20 MED ORDER — PROPOFOL 10 MG/ML IV BOLUS
INTRAVENOUS | Status: DC | PRN
  Administered 2017-08-20: 90 mg via INTRAVENOUS
  Administered 2017-08-20: 20 mg via INTRAVENOUS
  Administered 2017-08-20: 30 mg via INTRAVENOUS
  Administered 2017-08-20 (×2): 50 mg via INTRAVENOUS
  Administered 2017-08-20: 20 mg via INTRAVENOUS
  Administered 2017-08-20 (×2): 40 mg via INTRAVENOUS
  Administered 2017-08-20: 30 mg via INTRAVENOUS
  Administered 2017-08-20: 50 mg via INTRAVENOUS
  Administered 2017-08-20: 30 mg via INTRAVENOUS
  Administered 2017-08-20 (×2): 20 mg via INTRAVENOUS
  Administered 2017-08-20 (×3): 50 mg via INTRAVENOUS

## 2017-08-20 MED ORDER — GLYCOPYRROLATE 0.2 MG/ML IJ SOLN
INTRAMUSCULAR | Status: DC | PRN
  Administered 2017-08-20: 0.2 mg via INTRAVENOUS

## 2017-08-20 MED ORDER — ACETAMINOPHEN 325 MG PO TABS: 325.0000 mg | ORAL_TABLET | Freq: Once | ORAL | Status: DC

## 2017-08-20 MED ORDER — LACTATED RINGERS IV SOLN
INTRAVENOUS | Status: DC
  Administered 2017-08-20: 08:00:00 via INTRAVENOUS

## 2017-08-20 MED ORDER — STERILE WATER FOR IRRIGATION IR SOLN
Status: DC | PRN
  Administered 2017-08-20: 09:00:00

## 2017-08-20 MED ORDER — SODIUM CHLORIDE 0.9 % IV SOLN: INTRAVENOUS | Status: DC

## 2017-08-20 MED ORDER — LACTATED RINGERS IV SOLN
INTRAVENOUS | Status: DC
  Administered 2017-08-20: 09:00:00 via INTRAVENOUS

## 2017-08-20 SURGICAL SUPPLY — 36 items
BALLN DILATOR 10-12 8 (BALLOONS)
BALLN DILATOR 12-15 8 (BALLOONS)
BALLN DILATOR 15-18 8 (BALLOONS)
BALLN DILATOR CRE 0-12 8 (BALLOONS)
BALLN DILATOR ESOPH 8 10 CRE (MISCELLANEOUS) IMPLANT
BALLOON DILATOR 12-15 8 (BALLOONS) IMPLANT
BALLOON DILATOR 15-18 8 (BALLOONS) IMPLANT
BALLOON DILATOR CRE 0-12 8 (BALLOONS) IMPLANT
BLOCK BITE 60FR ADLT L/F GRN (MISCELLANEOUS) ×3 IMPLANT
CANISTER SUCT 1200ML W/VALVE (MISCELLANEOUS) ×3 IMPLANT
CLIP HMST 235XBRD CATH ROT (MISCELLANEOUS) IMPLANT
CLIP RESOLUTION 360 11X235 (MISCELLANEOUS)
ELECT REM PT RETURN 9FT ADLT (ELECTROSURGICAL)
ELECTRODE REM PT RTRN 9FT ADLT (ELECTROSURGICAL) IMPLANT
FCP ESCP3.2XJMB 240X2.8X (MISCELLANEOUS) ×2
FORCEPS BIOP RAD 4 LRG CAP 4 (CUTTING FORCEPS) IMPLANT
FORCEPS BIOP RJ4 240 W/NDL (MISCELLANEOUS) ×1
FORCEPS ESCP3.2XJMB 240X2.8X (MISCELLANEOUS) ×2 IMPLANT
GOWN CVR UNV OPN BCK APRN NK (MISCELLANEOUS) ×4 IMPLANT
GOWN ISOL THUMB LOOP REG UNIV (MISCELLANEOUS) ×2
INJECTOR VARIJECT VIN23 (MISCELLANEOUS) IMPLANT
KIT DEFENDO VALVE AND CONN (KITS) IMPLANT
KIT ENDO PROCEDURE OLY (KITS) ×3 IMPLANT
MARKER SPOT ENDO TATTOO 5ML (MISCELLANEOUS) IMPLANT
PROBE APC STR FIRE (PROBE) IMPLANT
RETRIEVER NET PLAT FOOD (MISCELLANEOUS) IMPLANT
RETRIEVER NET ROTH 2.5X230 LF (MISCELLANEOUS) IMPLANT
SNARE SHORT THROW 13M SML OVAL (MISCELLANEOUS) IMPLANT
SNARE SHORT THROW 30M LRG OVAL (MISCELLANEOUS) IMPLANT
SNARE SNG USE RND 15MM (INSTRUMENTS) IMPLANT
SPOT EX ENDOSCOPIC TATTOO (MISCELLANEOUS)
SYR INFLATION 60ML (SYRINGE) IMPLANT
TRAP ETRAP POLY (MISCELLANEOUS) IMPLANT
VARIJECT INJECTOR VIN23 (MISCELLANEOUS)
WATER STERILE IRR 250ML POUR (IV SOLUTION) ×3 IMPLANT
WIRE CRE 18-20MM 8CM F G (MISCELLANEOUS) IMPLANT

## 2017-08-20 NOTE — Op Note (Signed)
[facility]astroenter[facility] Patient Name: Debbie Chavez Procedure Date: 08/20/2017 9:18 AM MRN: [id number] Account #: [id number] Date of Birth: 1950-07-11 Admit Type: Outpatient Age: 67 Room: [facility] OR ROOM 01 Gender: Female Note Status: Finalized Procedure:            Colonoscopy Indications:          Screening for colorectal malignant neoplasm Providers:            Allora Bains B. Bonna Gains MD, MD Referring MD:         Sofie Hartigan (Referring MD) Medicines:            Monitored Anesthesia Care Complications:        No immediate complications. Procedure:            Pre-Anesthesia Assessment:                       - ASA Grade Assessment: II - A patient with mild                        systemic disease.                       - Prior to the procedure, a History and Physical was                        performed, and patient medications, allergies and                        sensitivities were reviewed. The patient's tolerance of                        previous anesthesia was reviewed.                       - The risks and benefits of the procedure and the                        sedation options and risks were discussed with the                        patient. All questions were answered and informed                        consent was obtained.                       - Patient identification and proposed procedure were                        verified prior to the procedure by the physician, the                        nurse, the anesthesiologist, the anesthetist and the                        technician. The procedure was verified in the procedure                        room.  After obtaining informed consent, the colonoscope was                        passed under direct vision. Throughout the procedure,                        the patient's blood pressure, pulse, and oxygen                        saturations were monitored continuously. The [place] [id number]) was introduced through the                        anus and advanced to the the cecum, identified by                        appendiceal orifice and ileocecal valve. The                        colonoscopy was performed with ease. The patient                        tolerated the procedure well. The quality of the bowel                        preparation was good. Findings:      The perianal and digital rectal examinations were normal.      A localized area of mildly thickened folds of the mucosa was found at       the ileocecal valve. Biopsies were taken with a cold forceps for       histology.      Two sessile polyps were found in the ascending colon. The polyps were 3       to 4 mm in size. These polyps were removed with a cold biopsy forceps.       Resection and retrieval were complete.      Multiple small-mouthed diverticula were found in the sigmoid colon.       There was no evidence of diverticular bleeding. There was sharp       angulations at the site of the diverticuosis. The colonoscope was       advanced with ease with abdominal pressure.      The exam was otherwise without abnormality.      The rectum, sigmoid colon, descending colon, transverse colon, ascending       colon and cecum appeared normal.      Retrflexion could not be performed due to a narrow rectum. Careful       frontal view of the rectum did not show any abnormalities. Impression:           - Thickened folds of the mucosa at the ileocecal valve.                        Biopsied.                       - Two 3 to 4 mm polyps in the ascending colon, removed  with a cold biopsy forceps. Resected and retrieved.                       - Diverticulosis in the sigmoid colon. There was no                        evidence of diverticular bleeding.                       - The examination was otherwise normal.                       - The rectum, sigmoid  colon, descending colon,                        transverse colon, ascending colon and cecum are normal. Recommendation:       - Discharge patient to home (with escort).                       - Advance diet as tolerated.                       - Continue present medications.                       - Await pathology results.                       - Repeat colonoscopy date to be determined after                        pending pathology results are reviewed for surveillance.                       - The findings and recommendations were discussed with                        the patient.                       - The findings and recommendations were discussed with                        the patient's family.                       - Return to primary care physician as previously                        scheduled.                       - High fiber diet. Procedure Code(s):    --- Professional ---                       [phone], Colonoscopy, flexible; with biopsy, single or                        multiple Diagnosis Code(s):    --- Professional ---                       Z12.11, Encounter for screening for malignant neoplasm  of colon                       K63.89, Other specified diseases of intestine                       D12.2, Benign neoplasm of ascending colon                       K57.30, Diverticulosis of large intestine without                        perforation or abscess without bleeding CPT copyright 2016 American Medical Association. All rights reserved. The codes documented in this report are preliminary and upon coder review may  be revised to meet current compliance requirements.  [name], MD Margretta Sidle B. Bonna Gains MD, MD 08/20/2017 10:29:19 AM This report has been signed electronically. Number of Addenda: 0 Note Initiated On: 08/20/2017 9:18 AM Scope Withdrawal Time: 0 hours 24 minutes 18 seconds  Total Procedure Duration: 0 hours 34 minutes 48 seconds   Estimated Blood Loss: Estimated blood loss: none.      [facility]

## 2017-08-20 NOTE — Anesthesia Postprocedure Evaluation (Signed)
Anesthesia Post Note  Patient: Debbie Chavez  Procedure(s) Performed: COLONOSCOPY WITH PROPOFOL (N/A ) ESOPHAGOGASTRODUODENOSCOPY (EGD) WITH PROPOFOL (N/A ) POLYPECTOMY  Anesthesia Type: General Level of consciousness: awake, awake and alert, oriented and patient cooperative Pain management: pain level controlled Vital Signs Assessment: post-procedure vital signs reviewed and stable Respiratory status: spontaneous breathing Cardiovascular status: blood pressure returned to baseline and stable Anesthetic complications: no    [name]

## 2017-08-20 NOTE — Anesthesia Procedure Notes (Signed)
Procedure Name: [place] Performed by: [name], CRNA Pre-anesthesia Checklist: Patient identified Patient Re-evaluated:Patient Re-evaluated prior to induction Preoxygenation: Pre-oxygenation with 100% oxygen Induction Type: IV induction

## 2017-08-20 NOTE — H&P (Signed)
Debbie Antigua, MD [address], [place], [place], [place], [address] [address], [place], [place], [place], [address] Phone: [phone]  Fax: [phone]  Primary Care Physician:  Sofie Hartigan, MD   Pre-Procedure History & Physical: HPI:  Debbie Chavez is a 67 y.o. female is here for a colonoscopy and EGD.   Past Medical History:  Diagnosis Date  . Anemia   . Arthritis    hands, knees  . Cancer ([place])   . Hypertension     Past Surgical History:  Procedure Laterality Date  . BILATERAL TOTAL MASTECTOMY WITH AXILLARY LYMPH NODE DISSECTION    . BREAST SURGERY    . COLONOSCOPY    . JOINT REPLACEMENT      Prior to Admission medications   Medication Sig Start Date End Date Taking? Authorizing Provider  Cyanocobalamin (VITAMIN B-12 PO) Take by mouth daily.   Yes [provider]  IRON PO Take by mouth daily.   Yes [provider]  lisinopril (PRINIVIL,ZESTRIL) 10 MG tablet  06/03/17  Yes [provider]  Multiple Vitamin (MULTIVITAMIN) tablet Take 1 tablet by mouth daily.   Yes [provider]  pantoprazole (PROTONIX) 40 MG tablet Take 1 tablet (40 mg total) by mouth daily. 07/09/17 07/09/18 Yes Schaevitz, Randall An, MD  traMADol Veatrice Bourbon) 50 MG tablet Take by mouth. 03/25/17  Yes [provider]  VITAMIN E PO Take by mouth daily.   Yes [provider]  diclofenac (VOLTAREN) 75 MG EC tablet  05/20/17   [provider]    Allergies as of 07/19/2017 - Review Complete 07/19/2017  Allergen Reaction Noted  . Meperidine Other (See Comments) 07/09/2017    Family History  Problem Relation Age of Onset  . Cancer Mother        breast  . Cancer Sister        breast    Social History   Socioeconomic History  . Marital status: Married    Spouse name: Not on file  . Number of children: Not on file  . Years of education: Not on file  . Highest education level: Not on file  Social Needs  .  Financial resource strain: Not on file  . Food insecurity - worry: Not on file  . Food insecurity - inability: Not on file  . Transportation needs - medical: Not on file  . Transportation needs - non-medical: Not on file  Occupational History  . Not on file  Tobacco Use  . Smoking status: Never Smoker  . Smokeless tobacco: Never Used  Substance and Sexual Activity  . Alcohol use: Yes    Comment: rae - Holidays  . Drug use: No  . Sexual activity: Not on file  Other Topics Concern  . Not on file  Social History Narrative  . Not on file    Review of Systems: See HPI, otherwise negative ROS  Physical Exam: BP 111/84   Pulse 88   Temp (!) 97.3 F (36.3 C) (Temporal)   Resp 16   Ht 5\' 5"  (1.651 m)   Wt 156 lb (70.8 kg)   SpO2 100%   BMI 25.96 kg/m  General:   Alert,  pleasant and cooperative in NAD Head:  Normocephalic and atraumatic. Neck:  Supple; no masses or thyromegaly. Lungs:  Clear throughout to auscultation, normal respiratory effort.    Heart:  +S1, +S2, Regular rate and rhythm, No edema. Abdomen:  Soft, nontender and nondistended. Normal bowel sounds, without guarding, and  without rebound.   Neurologic:  Alert and  oriented x4;  grossly normal neurologically.  Impression/Plan: Debbie Chavez is here for a colonoscopy to be performed for average risk screening and EGD for abdominal pain and abnormal CT.  Risks, benefits, limitations, and alternatives regarding the procedures have been reviewed with the patient.  Questions have been answered.  All parties agreeable.   Virgel Manifold, MD  08/20/2017, 9:08 AM

## 2017-08-20 NOTE — Anesthesia Postprocedure Evaluation (Signed)
Anesthesia Post Note  Patient: Debbie Chavez  Procedure(s) Performed: COLONOSCOPY WITH PROPOFOL (N/A ) ESOPHAGOGASTRODUODENOSCOPY (EGD) WITH PROPOFOL (N/A ) POLYPECTOMY  Patient location during evaluation: PACU Anesthesia Type: General Level of consciousness: awake and alert and oriented Pain management: satisfactory to patient Vital Signs Assessment: post-procedure vital signs reviewed and stable Respiratory status: spontaneous breathing, nonlabored ventilation and respiratory function stable Cardiovascular status: blood pressure returned to baseline and stable Postop Assessment: Adequate PO intake and No signs of nausea or vomiting Anesthetic complications: no    Raliegh Ip

## 2017-08-20 NOTE — Anesthesia Preprocedure Evaluation (Signed)
Anesthesia Evaluation  Patient identified by MRN, date of birth, ID band Patient awake    Reviewed: Allergy & Precautions, H&P , NPO status , Patient's Chart, lab work & pertinent test results  Airway Mallampati: II  TM Distance: >3 FB Neck ROM: full    Dental no notable dental hx.    Pulmonary    Pulmonary exam normal breath sounds clear to auscultation       Cardiovascular hypertension, Normal cardiovascular exam Rhythm:regular Rate:Normal     Neuro/Psych    GI/Hepatic   Endo/Other    Renal/GU      Musculoskeletal   Abdominal   Peds  Hematology   Anesthesia Other Findings   Reproductive/Obstetrics                             Anesthesia Physical Anesthesia Plan  ASA: II  Anesthesia Plan: General   Post-op Pain Management:    Induction: Intravenous  PONV Risk Score and Plan: 3 and Propofol infusion and Treatment may vary due to age or medical condition  Airway Management Planned: Natural Airway  Additional Equipment:   Intra-op Plan:   Post-operative Plan:   Informed Consent: I have reviewed the patients History and Physical, chart, labs and discussed the procedure including the risks, benefits and alternatives for the proposed anesthesia with the patient or authorized representative who has indicated his/her understanding and acceptance.     Plan Discussed with: CRNA  Anesthesia Plan Comments:         Anesthesia Quick Evaluation

## 2017-08-20 NOTE — Op Note (Signed)
[facility] Patient Name: Fumi Guadron Procedure Date: 08/20/2017 9:16 AM MRN: [id number] Account #: [id number] Date of Birth: [birth year] Admit Type: Outpatient Age: 67 Room: [facility] OR ROOM 01 Gender: Female Note Status: Finalized Procedure:            Upper GI endoscopy Indications:          Epigastric abdominal pain, Iron deficiency anemia,                        Abnormal CT of the GI tract Providers:            Varnita B. Bonna Gains MD, MD Referring MD:         Sofie Hartigan (Referring MD) Medicines:            Monitored Anesthesia Care Complications:        No immediate complications. Procedure:            Pre-Anesthesia Assessment:                       - The risks and benefits of the procedure and the                        sedation options and risks were discussed with the                        patient. All questions were answered and informed                        consent was obtained.                       - Patient identification and proposed procedure were                        verified prior to the procedure.                       - ASA Grade Assessment: II - A patient with mild                        systemic disease.                       After obtaining informed consent, the endoscope was                        passed under direct vision. Throughout the procedure,                        the patient's blood pressure, pulse, and oxygen                        saturations were monitored continuously. The Olympus                        GIF H180J Endoscope (S#: [id number]) was introduced                        through the mouth, and advanced to the second part of  duodenum. The upper GI endoscopy was accomplished with                        ease. The patient tolerated the procedure well. Findings:      The esophagus and gastroesophageal junction were examined with white       light. There were esophageal mucosal  changes suspicious for       short-segment Barrett's esophagus. These changes involved the mucosa at       the upper extent of the gastric folds (38 cm from the incisors)       extending to the Z-line. Circumferential salmon-colored mucosa was       present from 37 to 38 cm and scattered islands of salmon-colored mucosa       were present from 35 to 37 cm. The maximum longitudinal extent of these       esophageal mucosal changes was 3 cm in length. Mucosa was biopsied with       a cold forceps for histology in 4 quadrants at intervals of 1 cm. One       specimen bottle was sent to pathology.      Patchy mildly erythematous mucosa without bleeding was found in the       gastric antrum. Biopsies were taken with a cold forceps for histology.      There is no endoscopic evidence of ulceration in the entire examined       stomach.      A 1 cm hiatal hernia was present.      The examined duodenum was normal. Biopsies for histology were taken with       a cold forceps for evaluation of celiac disease. Impression:           - Esophageal mucosal changes suspicious for                        short-segment Barrett's esophagus. Biopsied.                       - Erythematous mucosa in the antrum. Biopsied.                       - 1 cm hiatal hernia.                       - Normal examined duodenum. Biopsied. Recommendation:       - Await pathology results.                       - Continue present medications.                       - Return to my office as previously scheduled.                       - Follow an antireflux regimen.                       - The findings and recommendations were discussed with                        the patient.                       - The findings  and recommendations were discussed with                        the patient's family. Procedure Code(s):    --- Professional ---                       [phone], Esophagogastroduodenoscopy, flexible, transoral;                         with biopsy, single or multiple Diagnosis Code(s):    --- Professional ---                       K22.8, Other specified diseases of esophagus                       K31.89, Other diseases of stomach and duodenum                       K44.9, Diaphragmatic hernia without obstruction or                        gangrene                       R10.13, Epigastric pain                       D50.9, Iron deficiency anemia, unspecified                       R93.3, Abnormal findings on diagnostic imaging of other                        parts of digestive tract CPT copyright 2016 American Medical Association. All rights reserved. The codes documented in this report are preliminary and upon coder review may  be revised to meet current compliance requirements.  [name], MD Margretta Sidle B. Bonna Gains MD, MD 08/20/2017 9:45:13 AM This report has been signed electronically. Number of Addenda: 0 Note Initiated On: 08/20/2017 9:16 AM      [facility]

## 2017-08-20 NOTE — Transfer of Care (Addendum)
Immediate Anesthesia Transfer of Care Note  Patient: Debbie Chavez  Procedure(s) Performed: COLONOSCOPY WITH PROPOFOL (N/A ) ESOPHAGOGASTRODUODENOSCOPY (EGD) WITH PROPOFOL (N/A ) POLYPECTOMY  Patient Location: PACU  Anesthesia Type: General  Level of Consciousness: awake, alert  and patient cooperative  Airway and Oxygen Therapy: Patient Spontanous Breathing and Patient connected to supplemental oxygen  Post-op Assessment: Post-op Vital signs reviewed, Patient's Cardiovascular Status Stable, Respiratory Function Stable, Patent Airway and No signs of Nausea or vomiting  Post-op Vital Signs: Reviewed and stable  Complications: No apparent anesthesia complications

## 2017-08-23 ENCOUNTER — Encounter: Payer: Self-pay | Admitting: Gastroenterology

## 2017-09-11 ENCOUNTER — Telehealth: Payer: Self-pay | Admitting: Gastroenterology

## 2017-09-11 NOTE — Telephone Encounter (Signed)
Patient would like for you to call her with colonoscopy results

## 2017-09-12 ENCOUNTER — Telehealth: Payer: Self-pay | Admitting: Gastroenterology

## 2017-09-12 NOTE — Telephone Encounter (Signed)
Patient called again for pathology results.

## 2017-09-12 NOTE — Telephone Encounter (Signed)
Message sent to Dr. Bonna Gains that pt had sent again.

## 2017-09-16 NOTE — Telephone Encounter (Signed)
Spoke with pt on 09/13/17, results given.

## 2017-09-26 ENCOUNTER — Ambulatory Visit (INDEPENDENT_AMBULATORY_CARE_PROVIDER_SITE_OTHER): Payer: Medicare Other | Admitting: Gastroenterology

## 2017-09-26 ENCOUNTER — Encounter (INDEPENDENT_AMBULATORY_CARE_PROVIDER_SITE_OTHER): Payer: Self-pay

## 2017-09-26 DIAGNOSIS — K2270 Barrett's esophagus without dysplasia: Secondary | ICD-10-CM

## 2017-09-26 NOTE — Patient Instructions (Signed)
F/U 4-6 monts Continue PPI Contact office in 3-4 months to schedule EGD.   Barrett Esophagus Barrett esophagus occurs when the tissue that lines the esophagus changes or becomes damaged. The esophagus is the tube that carries food from the throat to the stomach. With Barrett esophagus, the cells that line the esophagus are replaced by cells that are similar to the lining of the intestines (intestinal metaplasia). Barrett esophagus itself may not cause any symptoms. However, many people who have Barrett esophagus also have gastroesophageal reflux disease (GERD), which may cause symptoms such as heartburn. Treatment may include medicines, procedures to destroy the abnormal cells, or surgery. Over time, a few people with this condition may develop cancer of the esophagus. What are the causes? The exact cause of this condition is not known. In some cases, the condition develops from damage to the lining of the esophagus caused by GERD. GERD occurs when stomach acids flow up from the stomach into the esophagus. Frequent symptoms of GERD may cause intestinal metaplasia or cause cell changes (dysplasia). What increases the risk? The following factors may make you more likely to develop this condition:  Having GERD.  Being any of the following: ? Female. ? White (Caucasian). ? Obese. ? Older than 50.  Having a hiatal hernia.  Smoking.  What are the signs or symptoms? People with Barrett esophagus often have no symptoms. However, many people with this condition also have GERD. Symptoms of GERD may include:  Heartburn.  Difficulty swallowing.  Dry cough.  How is this diagnosed? Barrett esophagus may be diagnosed with an exam called an upper gastrointestinal endoscopy. During this exam, a thin, flexible tube (endoscope) is passed down your esophagus. The endoscope has a light and camera on the end of it. Your health care provider uses the endoscope to view the inside of your esophagus. During  the exam, several tissue samples will be removed (biopsy) from your esophagus so they can be checked for intestinal metaplasia or dysplasia. How is this treated? Treatment for this condition may include:  Medicines (proton pump inhibitors, or PPIs) to decrease or stop GERD.  Periodic endoscopic exams to make sure that cancer is not developing.  A procedure or surgery for dysplasia. This may include: ? Endoscopic removal or destruction of abnormal cells. ? Removal of part of the esophagus (esophagectomy).  Follow these instructions at home: Eating and drinking  Eat more fruits and vegetables.  Avoid fatty foods.  Eat small, frequent meals instead of large meals.  Avoid foods that cause heartburn. These foods include: ? Coffee and alcoholic drinks. ? Tomatoes and foods made with tomatoes. ? Greasy or spicy foods. ? Chocolate and peppermint. General instructions   Take over-the-counter and prescription medicines only as told by your health care provider.  Do not use any tobacco products, such as cigarettes, chewing tobacco, and e-cigarettes. If you need help quitting, ask your health care provider.  If your health care provider is treating you for GERD, make sure you follow all instructions and take medicines as directed.  Keep all follow-up visits as told by your health care provider. This is important. Contact a health care provider if:  You have heartburn or GERD symptoms.  You have difficulty swallowing. Get help right away if:  You have chest pain.  You are unable to swallow.  You vomit blood or material that looks like coffee grounds.  Your stool (feces) is bright red or dark. This information is not intended to replace advice given to  you by your health care provider. Make sure you discuss any questions you have with your health care provider. Document Released: 09/01/2003 Document Revised: 11/17/2015 Document Reviewed: 03/24/2015 Elsevier Interactive Patient  Education  2018 [place].  Gastroesophageal Reflux Disease, Adult Normally, food travels down the esophagus and stays in the stomach to be digested. If a person has gastroesophageal reflux disease (GERD), food and stomach acid move back up into the esophagus. When this happens, the esophagus becomes sore and swollen (inflamed). Over time, GERD can make small holes (ulcers) in the lining of the esophagus. Follow these instructions at home: Diet  Follow a diet as told by your doctor. You may need to avoid foods and drinks such as: ? Coffee and tea (with or without caffeine). ? Drinks that contain alcohol. ? Energy drinks and sports drinks. ? Carbonated drinks or sodas. ? Chocolate and cocoa. ? Peppermint and mint flavorings. ? Garlic and onions. ? Horseradish. ? Spicy and acidic foods, such as peppers, chili powder, curry powder, vinegar, hot sauces, and BBQ sauce. ? Citrus fruit juices and citrus fruits, such as oranges, lemons, and limes. ? Tomato-based foods, such as red sauce, chili, salsa, and pizza with red sauce. ? Fried and fatty foods, such as donuts, french fries, potato chips, and high-fat dressings. ? High-fat meats, such as hot dogs, rib eye steak, sausage, ham, and bacon. ? High-fat dairy items, such as whole milk, butter, and cream cheese.  Eat small meals often. Avoid eating large meals.  Avoid drinking large amounts of liquid with your meals.  Avoid eating meals during the 2-3 hours before bedtime.  Avoid lying down right after you eat.  Do not exercise right after you eat. General instructions  Pay attention to any changes in your symptoms.  Take over-the-counter and prescription medicines only as told by your doctor. Do not take aspirin, ibuprofen, or other NSAIDs unless your doctor says it is okay.  Do not use any tobacco products, including cigarettes, chewing tobacco, and e-cigarettes. If you need help quitting, ask your doctor.  Wear loose clothes. Do  not wear anything tight around your waist.  Raise (elevate) the head of your bed about 6 inches (15 cm).  Try to lower your stress. If you need help doing this, ask your doctor.  If you are overweight, lose an amount of weight that is healthy for you. Ask your doctor about a safe weight loss goal.  Keep all follow-up visits as told by your doctor. This is important. Contact a doctor if:  You have new symptoms.  You lose weight and you do not know why it is happening.  You have trouble swallowing, or it hurts to swallow.  You have wheezing or a cough that keeps happening.  Your symptoms do not get better with treatment.  You have a hoarse voice. Get help right away if:  You have pain in your arms, neck, jaw, teeth, or back.  You feel sweaty, dizzy, or light-headed.  You have chest pain or shortness of breath.  You throw up (vomit) and your throw up looks like blood or coffee grounds.  You pass out (faint).  Your poop (stool) is bloody or black.  You cannot swallow, drink, or eat. This information is not intended to replace advice given to you by your health care provider. Make sure you discuss any questions you have with your health care provider. Document Released: 11/28/2007 Document Revised: 11/17/2015 Document Reviewed: 10/06/2014 Elsevier Interactive Patient Education  2018 [employer]vier  Inc[employer]  

## 2017-09-26 NOTE — Progress Notes (Signed)
[name], MD [address]  [address], [place] [address]  Main: [phone]  Fax: [phone]   Primary Care Physician: Sofie Hartigan, MD  Primary Gastroenterologist:  Dr. [name]  Chief Complaint  Patient presents with  . Follow-up    discuss results from EGD    HPI: Debbie Chavez is a 67 y.o. female who was initially seen for isolated episode of abdominal pain, and abnormal CT scan showing duodenal thickening.  She has since undergone EGD and colonoscopy.  EGD showed a normal duodenum.  Erythematous mucosa in the antrum.  1 cm hiatal hernia.  Barrett's esophagus with circumferential Barrett's from 37 to 38 cm and maximal extent to 35 cm.  C1 M2 Barrett's.  Biopsies did not show any dysplasia.  Colonoscopy showed 2, 3 to 4 mm polyps in the ascending colon.  Removed with cold biopsy forceps.  Taken for the ileocecal valve, biopsied.  Diverticulosis.  Polypoid ileocolonic mucosa with lymphoid aggregate was seen on the ileocecal valve biopsies.  Tubular adenoma was seen on the polyps.  Repeat colonoscopy recommended in 5 years.  Patient denies any abdominal pain.  Denies any heartburn, regurgitation or bad taste in mouth.  Denies any weight loss.  No dysphagia.  was started on once daily pantoprazole by her primary care provider in January 2019 and has been taking it since then.  Current Outpatient Medications  Medication Sig Dispense Refill  . Cyanocobalamin (VITAMIN B-12 PO) Take by mouth daily.    . diclofenac (VOLTAREN) 75 MG EC tablet     . IRON PO Take by mouth daily.    Marland Kitchen lisinopril (PRINIVIL,ZESTRIL) 10 MG tablet     . Multiple Vitamin (MULTIVITAMIN) tablet Take 1 tablet by mouth daily.    . pantoprazole (PROTONIX) 40 MG tablet Take 1 tablet (40 mg total) by mouth daily. 30 tablet 0  . traMADol (ULTRAM) 50 MG tablet Take by mouth.    Marland Kitchen VITAMIN E PO Take by mouth daily.     No current facility-administered medications for this visit.      Allergies as of 09/26/2017 - Review Complete 08/20/2017  Allergen Reaction Noted  . Meperidine Other (See Comments) 07/09/2017    ROS:  General: Negative for anorexia, weight loss, fever, chills, fatigue, weakness. ENT: Negative for hoarseness, difficulty swallowing , nasal congestion. CV: Negative for chest pain, angina, palpitations, dyspnea on exertion, peripheral edema.  Respiratory: Negative for dyspnea at rest, dyspnea on exertion, cough, sputum, wheezing.  GI: See history of present illness. GU:  Negative for dysuria, hematuria, urinary incontinence, urinary frequency, nocturnal urination.  Endo: Negative for unusual weight change.    Physical Examination:   There were no vitals taken for this visit.  General: Well-nourished, well-developed in no acute distress.  Eyes: No icterus. Conjunctivae pink. Mouth: Oropharyngeal mucosa moist and pink , no lesions erythema or exudate. Neck: Supple, Trachea midline Abdomen: Bowel sounds are normal, nontender, nondistended, no hepatosplenomegaly or masses, no abdominal bruits or hernia , no rebound or guarding.   Extremities: No lower extremity edema. No clubbing or deformities. Neuro: Alert and oriented x 3.  Grossly intact. Skin: Warm and dry, no jaundice.   Psych: Alert and cooperative, normal mood and affect.   Labs: CMP     Component Value Date/Time   NA 140 07/09/2017 0631   K 4.2 07/09/2017 0631   CL 104 07/09/2017 0631   CO2 25 07/09/2017 0631   GLUCOSE 117 (H) 07/09/2017 0631  BUN 34 (H) 07/09/2017 0631   CREATININE 1.25 (H) 07/09/2017 0631   CREATININE 1.08 08/14/2011 0932   CALCIUM 10.0 07/09/2017 0631   PROT 7.8 07/09/2017 0631   PROT 7.1 08/14/2011 0932   ALBUMIN 4.6 07/09/2017 0631   ALBUMIN 4.2 08/14/2011 0932   AST 32 07/09/2017 0631   AST 25 08/14/2011 0932   ALT 24 07/09/2017 0631   ALT 33 08/14/2011 0932   ALKPHOS 90 07/09/2017 0631   ALKPHOS 107 08/14/2011 0932   BILITOT 1.2 07/09/2017 0631     BILITOT 1.0 08/28/2011 0833   GFRNONAA 44 (L) 07/09/2017 0631   GFRNONAA 55 (L) 08/14/2011 0932   GFRAA 51 (L) 07/09/2017 0631   GFRAA >60 08/14/2011 0932   Lab Results  Component Value Date   WBC 12.1 (H) 07/09/2017   HGB 12.9 07/09/2017   HCT 38.8 07/09/2017   MCV 94.4 07/09/2017   PLT 277 07/09/2017    Imaging Studies: No results found.  Assessment and Plan:   Debbie Chavez is a 67 y.o. y/o female with isolated episode of abdominal pain that has resolved, patient on Protonix once daily, with recent EGD and colonoscopy showing Barrett's esophagus, C1 M2  No symptoms of heartburn No dyspepsia Continue PPI once daily to avoid worsening Barrett's If heartburn recurs, or abdominal pain recurs, patient was to contact us and PPI can be increased in dosage (Risks of PPI use were discussed with patient including bone loss, C. Diff diarrhea, pneumonia, infections, CKD, electrolyte abnormalities.  If clinically possible based on symptoms, goal would be to maintain patient on the lowest dose possible, or discontinue the medication with institution of acid reflux lifestyle modifications over time. Pt. Verbalizes understanding and chooses to continue the medication.) Patient educated extensively on acid reflux lifestyle modification, including buying a bed wedge, not eating 3 hrs before bedtime, diet modifications, and handout given for the same.   I have extensively recently discussed Barrett's esophagus.  We have discussed rates of cancer associated Barrett's by itself, compared to lower high-grade dysplasia.  No dysplasia was present on her biopsies.  However, she needs biopsies every 1 cm.  We have discussed no further surveillance versus repeat EGD with biopsies.  She chooses to continue surveillance.  I have recommended this to be done in the next 3 to 6 months.  And if no dysplasia, we can do this in 2 to 3 years after that.   I have discussed alternative options, risks & benefits,   which include, but are not limited to, bleeding, infection, perforation,respiratory complication & drug reaction.  The patient agrees with this plan & written consent will be obtained.    Colonoscopy surveillance due in Feb 2024   Dr [name]

## 2017-12-03 ENCOUNTER — Telehealth: Payer: Self-pay

## 2017-12-03 DIAGNOSIS — K2270 Barrett's esophagus without dysplasia: Secondary | ICD-10-CM

## 2018-01-02 ENCOUNTER — Other Ambulatory Visit: Payer: Self-pay

## 2018-01-02 DIAGNOSIS — K2270 Barrett's esophagus without dysplasia: Secondary | ICD-10-CM

## 2018-01-02 NOTE — Telephone Encounter (Signed)
Pt scheduled 02/12/18 for f/u EGD procedure.

## 2018-02-11 ENCOUNTER — Encounter: Payer: Self-pay | Admitting: *Deleted

## 2018-02-12 ENCOUNTER — Other Ambulatory Visit: Payer: Self-pay

## 2018-02-12 ENCOUNTER — Ambulatory Visit
Admission: RE | Admit: 2018-02-12 | Discharge: 2018-02-12 | Disposition: A | Discharge status: Home / Self Care | Payer: Medicare Other | Source: Ambulatory Visit | Attending: Gastroenterology | Admitting: Gastroenterology

## 2018-02-12 ENCOUNTER — Ambulatory Visit: Payer: Medicare Other | Admitting: Certified Registered Nurse Anesthetist

## 2018-02-12 ENCOUNTER — Encounter
Admission: RE | Disposition: A | Discharge status: Home / Self Care | Payer: Self-pay | Source: Ambulatory Visit | Attending: Gastroenterology

## 2018-02-12 DIAGNOSIS — K449 Diaphragmatic hernia without obstruction or gangrene: Secondary | ICD-10-CM | POA: Diagnosis not present

## 2018-02-12 DIAGNOSIS — Z79899 Other long term (current) drug therapy: Secondary | ICD-10-CM | POA: Insufficient documentation

## 2018-02-12 DIAGNOSIS — I10 Essential (primary) hypertension: Secondary | ICD-10-CM | POA: Diagnosis not present

## 2018-02-12 DIAGNOSIS — K219 Gastro-esophageal reflux disease without esophagitis: Secondary | ICD-10-CM | POA: Diagnosis not present

## 2018-02-12 DIAGNOSIS — K2950 Unspecified chronic gastritis without bleeding: Secondary | ICD-10-CM | POA: Insufficient documentation

## 2018-02-12 DIAGNOSIS — Z853 Personal history of malignant neoplasm of breast: Secondary | ICD-10-CM | POA: Diagnosis not present

## 2018-02-12 DIAGNOSIS — K2270 Barrett's esophagus without dysplasia: Secondary | ICD-10-CM | POA: Diagnosis not present

## 2018-02-12 HISTORY — PX: ESOPHAGOGASTRODUODENOSCOPY (EGD) WITH PROPOFOL: SHX5813

## 2018-02-12 HISTORY — DX: Gastro-esophageal reflux disease without esophagitis: K21.9

## 2018-02-12 HISTORY — DX: Personal history of other diseases of the digestive system: Z87.19

## 2018-02-12 SURGERY — ESOPHAGOGASTRODUODENOSCOPY (EGD) WITH PROPOFOL
Anesthesia: General

## 2018-02-12 MED ORDER — PHENYLEPHRINE HCL 10 MG/ML IJ SOLN
INTRAMUSCULAR | Status: DC | PRN
  Administered 2018-02-12: 100 ug via INTRAVENOUS

## 2018-02-12 MED ORDER — LIDOCAINE HCL (PF) 2 % IJ SOLN
INTRAMUSCULAR | Status: AC
  Filled 2018-02-12: qty 10

## 2018-02-12 MED ORDER — PROPOFOL 10 MG/ML IV BOLUS
INTRAVENOUS | Status: AC
  Filled 2018-02-12: qty 40

## 2018-02-12 MED ORDER — GLYCOPYRROLATE 0.2 MG/ML IJ SOLN
INTRAMUSCULAR | Status: AC
  Filled 2018-02-12: qty 1

## 2018-02-12 MED ORDER — SODIUM CHLORIDE 0.9 % IV SOLN
INTRAVENOUS | Status: DC
  Administered 2018-02-12: 08:00:00 via INTRAVENOUS

## 2018-02-12 MED ORDER — PROPOFOL 10 MG/ML IV BOLUS
INTRAVENOUS | Status: DC | PRN
  Administered 2018-02-12 (×2): 20 mg via INTRAVENOUS
  Administered 2018-02-12: 30 mg via INTRAVENOUS
  Administered 2018-02-12 (×3): 20 mg via INTRAVENOUS
  Administered 2018-02-12: 10 mg via INTRAVENOUS
  Administered 2018-02-12 (×2): 20 mg via INTRAVENOUS
  Administered 2018-02-12: 50 mg via INTRAVENOUS
  Administered 2018-02-12: 20 mg via INTRAVENOUS
  Administered 2018-02-12: 50 mg via INTRAVENOUS

## 2018-02-12 MED ORDER — PHENYLEPHRINE HCL 10 MG/ML IJ SOLN
INTRAMUSCULAR | Status: AC
  Filled 2018-02-12: qty 1

## 2018-02-12 MED ORDER — LIDOCAINE HCL (CARDIAC) PF 100 MG/5ML IV SOSY
PREFILLED_SYRINGE | INTRAVENOUS | Status: DC | PRN
  Administered 2018-02-12: 50 mg via INTRAVENOUS

## 2018-02-12 MED ORDER — SUCCINYLCHOLINE CHLORIDE 20 MG/ML IJ SOLN
INTRAMUSCULAR | Status: AC
  Filled 2018-02-12: qty 1

## 2018-02-12 NOTE — Anesthesia Procedure Notes (Signed)
Procedure Name: MAC Date/Time: 02/12/2018 8:09 AM Performed by: Rudean Hitt, CRNA Pre-anesthesia Checklist: Patient identified, Emergency Drugs available, Suction available, Patient being monitored and Timeout performed Patient Re-evaluated:Patient Re-evaluated prior to induction Oxygen Delivery Method: Nasal cannula

## 2018-02-12 NOTE — Op Note (Signed)
[facility] Gastroenterology Patient Name: Debbie Chavez Procedure Date: 02/12/2018 8:05 AM MRN: [id number] Account #: [id number] Date of Birth: [birth year] Admit Type: Outpatient Age: 67 Room: [facility] ENDO ROOM 3 Gender: Female Note Status: Finalized Procedure:            Upper GI endoscopy Indications:          Barrett's esophagus, Follow-up of Barrett's esophagus Providers:            Shomari Scicchitano B. Bonna Gains MD, MD Referring MD:         Sofie Hartigan (Referring MD) Medicines:            Monitored Anesthesia Care Complications:        No immediate complications. Procedure:            Pre-Anesthesia Assessment:                       - The risks and benefits of the procedure and the                        sedation options and risks were discussed with the                        patient. All questions were answered and informed                        consent was obtained.                       - Patient identification and proposed procedure were                        verified prior to the procedure.                       - ASA Grade Assessment: II - A patient with mild                        systemic disease.                       After obtaining informed consent, the endoscope was                        passed under direct vision. Throughout the procedure,                        the patient's blood pressure, pulse, and oxygen                        saturations were monitored continuously. The Endoscope                        was introduced through the mouth, and advanced to the                        second part of duodenum. The upper GI endoscopy was                        accomplished with ease. The patient tolerated the  procedure well. Findings:      The esophagus and gastroesophageal junction were examined with white       light. There were esophageal mucosal changes classified as Barrett's       stage C1-M2 per [place] criteria. These  changes involved the mucosa along       an irregular Z-line (34 cm from the incisors). Circumferential       salmon-colored mucosa was present from 34 to 35 cm and scattered islands       of salmon-colored mucosa were present from 32 to 34 cm. The maximum       longitudinal extent of these esophageal mucosal changes was 3 cm in       length. Mucosa was biopsied with a cold forceps for histology in 4       quadrants at intervals of 1 cm from 32 to 35 cm from the incisors. A       total of 4 specimen bottles were sent to pathology.      A small hiatal hernia was present.      The entire examined stomach was normal.      The examined duodenum was normal. Impression:           - Esophageal mucosal changes classified as Barrett's                        stage C1-M2 per Prague criteria. Biopsied.                       - Small hiatal hernia.                       - Normal stomach.                       - Normal examined duodenum. Recommendation:       - Await pathology results.                       - Follow an antireflux regimen.                       - Continue present medications.                       - Return to my office as previously scheduled.                       - The findings and recommendations were discussed with                        the patient.                       - The findings and recommendations were discussed with                        the patient's family. Procedure Code(s):    --- Professional ---                       [phone], Esophagogastroduodenoscopy, flexible, transoral;                        with biopsy, single or multiple Diagnosis Code(s):    ---  Professional ---                       K22.70, Barrett's esophagus without dysplasia                       K44.9, Diaphragmatic hernia without obstruction or                        gangrene CPT copyright 2017 American Medical Association. All rights reserved. The codes documented in this report are preliminary and  upon coder review may  be revised to meet current compliance requirements.  [name], MD Margretta Sidle B. Bonna Gains MD, MD 02/12/2018 8:37:05 AM This report has been signed electronically. Number of Addenda: 0 Note Initiated On: 02/12/2018 8:05 AM Estimated Blood Loss: Estimated blood loss: none.      [facility]

## 2018-02-12 NOTE — Anesthesia Preprocedure Evaluation (Addendum)
Anesthesia Evaluation  Patient identified by MRN, date of birth, ID band Patient awake    Reviewed: Allergy & Precautions, H&P , NPO status , Patient's Chart, lab work & pertinent test results  Airway Mallampati: II  TM Distance: >3 FB Neck ROM: full    Dental   Pulmonary neg pulmonary ROS,           Cardiovascular hypertension, negative cardio ROS       Neuro/Psych negative neurological ROS  negative psych ROS   GI/Hepatic negative GI ROS, Neg liver ROS, hiatal hernia, GERD  ,Barrett's esophagus   Endo/Other  negative endocrine ROS  Renal/GU negative Renal ROS  negative genitourinary   Musculoskeletal  (+) Arthritis ,   Abdominal   Peds  Hematology negative hematology ROS (+) anemia ,   Anesthesia Other Findings Past Medical History: No date: Anemia No date: Arthritis     Comment:  hands, knees 06/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  No date: GERD (gastroesophageal reflux disease) No date: History of hiatal hernia No date: Hypertension  Past Surgical History: 2007: BREAST SURGERY; Right     Comment:  lumpectomy No date: COLONOSCOPY 08/20/2017: COLONOSCOPY WITH PROPOFOL; N/A     Comment:  Procedure: COLONOSCOPY WITH PROPOFOL;  Surgeon:               Virgel Manifold, MD;  Location: [facility];              Service: Endoscopy;  Laterality: N/A; 08/20/2017: ESOPHAGOGASTRODUODENOSCOPY (EGD) WITH PROPOFOL; N/A     Comment:  Procedure: ESOPHAGOGASTRODUODENOSCOPY (EGD) WITH               PROPOFOL;  Surgeon: Virgel Manifold, MD;  Location:               [facility];  Service: Endoscopy;  Laterality:               N/A; No date: JOINT REPLACEMENT 06/2007: MASTECTOMY; Bilateral     Comment:  no lumph nodes removed 08/20/2017: POLYPECTOMY     Comment:  Procedure: POLYPECTOMY;  Surgeon: Virgel Manifold,               MD;  Location: [facility];  Service:  Endoscopy;;  BMI    Body Mass Index:  26.96 kg/m      Reproductive/Obstetrics negative OB ROS                           Anesthesia Physical Anesthesia Plan  ASA: II  Anesthesia Plan: General   Post-op Pain Management:    Induction:   PONV Risk Score and Plan: Propofol infusion  Airway Management Planned:   Additional Equipment:   Intra-op Plan:   Post-operative Plan:   Informed Consent: I have reviewed the patients History and Physical, chart, labs and discussed the procedure including the risks, benefits and alternatives for the proposed anesthesia with the patient or authorized representative who has indicated his/her understanding and acceptance.   Dental Advisory Given  Plan Discussed with: Anesthesiologist, CRNA and Surgeon  Anesthesia Plan Comments:        Anesthesia Quick Evaluation

## 2018-02-12 NOTE — Anesthesia Post-op Follow-up Note (Signed)
Anesthesia QCDR form completed.        

## 2018-02-12 NOTE — Anesthesia Postprocedure Evaluation (Signed)
Anesthesia Post Note  Patient: Debbie Chavez  Procedure(s) Performed: ESOPHAGOGASTRODUODENOSCOPY (EGD) WITH PROPOFOL (N/A )  Patient location during evaluation: PACU Anesthesia Type: General Level of consciousness: awake and alert Pain management: pain level controlled Vital Signs Assessment: post-procedure vital signs reviewed and stable Respiratory status: spontaneous breathing, nonlabored ventilation and respiratory function stable Cardiovascular status: blood pressure returned to baseline and stable Postop Assessment: no apparent nausea or vomiting Anesthetic complications: no     Last Vitals:  Vitals:   02/12/18 0850 02/12/18 0853  BP: 100/60 (!) 96/59  Pulse: (!) 56 (!) 54  Resp: 16 13  Temp:    SpO2: 100% 100%    Last Pain:  Vitals:   02/12/18 0834  TempSrc: Tympanic  PainSc:                  Durenda Hurt

## 2018-02-12 NOTE — Transfer of Care (Signed)
Immediate Anesthesia Transfer of Care Note  Patient: Debbie Chavez  Procedure(s) Performed: ESOPHAGOGASTRODUODENOSCOPY (EGD) WITH PROPOFOL (N/A )  Patient Location: PACU  Anesthesia Type:General  Level of Consciousness: drowsy  Airway & Oxygen Therapy: Patient Spontanous Breathing and Patient connected to nasal cannula oxygen  Post-op Assessment: Report given to RN and Post -op Vital signs reviewed and stable  Post vital signs: Reviewed and stable  Last Vitals:  Vitals Value Taken Time  BP 85/56 02/12/2018  8:35 AM  Temp 36.1 C 02/12/2018  8:34 AM  Pulse 55 02/12/2018  8:36 AM  Resp 14 02/12/2018  8:36 AM  SpO2 100 % 02/12/2018  8:36 AM  Vitals shown include unvalidated device data.  Last Pain:  Vitals:   02/12/18 0834  TempSrc: Tympanic  PainSc:          Complications: No apparent anesthesia complications

## 2018-02-12 NOTE — H&P (Signed)
Debbie Antigua, MD [address] [place], [place], [place], [address] [address], [place], [place], [place], [address] Phone: [phone]  Fax: [phone]  Primary Care Physician:  Debbie Hartigan, MD   Pre-Procedure History & Physical: HPI:  Debbie Chavez is a 67 y.o. female is here for an EGD.   Past Medical History:  Diagnosis Date  . Anemia   . Arthritis    hands, knees  . Cancer ([place]) 06/2007   right breast   . GERD (gastroesophageal reflux disease)   . History of hiatal hernia   . Hypertension     Past Surgical History:  Procedure Laterality Date  . BREAST SURGERY Right 2007   lumpectomy  . COLONOSCOPY    . COLONOSCOPY WITH PROPOFOL N/A 08/20/2017   Procedure: COLONOSCOPY WITH PROPOFOL;  Surgeon: Debbie Manifold, MD;  Location: [facility];  Service: Endoscopy;  Laterality: N/A;  . ESOPHAGOGASTRODUODENOSCOPY (EGD) WITH PROPOFOL N/A 08/20/2017   Procedure: ESOPHAGOGASTRODUODENOSCOPY (EGD) WITH PROPOFOL;  Surgeon: Debbie Manifold, MD;  Location: [facility];  Service: Endoscopy;  Laterality: N/A;  . JOINT REPLACEMENT    . MASTECTOMY Bilateral 06/2007   no lumph nodes removed  . POLYPECTOMY  08/20/2017   Procedure: POLYPECTOMY;  Surgeon: Debbie Manifold, MD;  Location: [facility];  Service: Endoscopy;;    Prior to Admission medications   Medication Sig Start Date End Date Taking? Authorizing Provider  lisinopril (PRINIVIL,ZESTRIL) 10 MG tablet  06/03/17  Yes [provider]  pantoprazole (PROTONIX) 40 MG tablet Take 1 tablet (40 mg total) by mouth daily. 07/09/17 07/09/18 Yes Schaevitz, Randall An, MD  Cyanocobalamin (VITAMIN B-12 PO) Take by mouth daily.    [provider]  diclofenac (VOLTAREN) 75 MG EC tablet  05/20/17   [provider]  IRON PO Take by mouth daily.    [provider]  Multiple Vitamin (MULTIVITAMIN) tablet Take 1 tablet by mouth daily.    [provider]  traMADol (ULTRAM) 50 MG tablet Take by mouth. 03/25/17   [provider]  VITAMIN E PO Take by mouth daily.    [provider]    Allergies as of 01/02/2018 - Review Complete 12/03/2017  Allergen Reaction Noted  . Meperidine Other (See Comments) 07/09/2017    Family History  Problem Relation Age of Onset  . Cancer Mother        breast  . Cancer Sister        breast    Social History   Socioeconomic History  . Marital status: Married    Spouse name: Not on file  . Number of children: Not on file  . Years of education: Not on file  . Highest education level: Not on file  Occupational History  . Not on file  Social Needs  . Financial resource strain: Not on file  . Food insecurity:    Worry: Not on file    Inability: Not on file  . Transportation needs:    Medical: Not on file    Non-medical: Not on file  Tobacco Use  . Smoking status: Never Smoker  . Smokeless tobacco: Never Used  Substance and Sexual Activity  . Alcohol use: Yes    Comment: rare - Holidays  . Drug use: No  . Sexual activity: Not on file  Lifestyle  . Physical activity:    Days per week: Not on file    Minutes per session: Not on file  . Stress:  Not on file  Relationships  . Social connections:    Talks on phone: Not on file    Gets together: Not on file    Attends religious service: Not on file    Active member of club or organization: Not on file    Attends meetings of clubs or organizations: Not on file    Relationship status: Not on file  . Intimate partner violence:    Fear of current or ex partner: Not on file    Emotionally abused: Not on file    Physically abused: Not on file    Forced sexual activity: Not on file  Other Topics Concern  . Not on file  Social History Narrative  . Not on file    Review of Systems: See HPI, otherwise negative ROS  Physical Exam: BP 122/73   Pulse 60   Temp (!) 96.5 F (35.8 C) (Tympanic)   Resp 16   Ht 5'  5" (1.651 m)   Wt 73.5 kg   SpO2 96%   BMI 26.96 kg/m  General:   Alert,  pleasant and cooperative in NAD Head:  Normocephalic and atraumatic. Neck:  Supple; no masses or thyromegaly. Lungs:  Clear throughout to auscultation, normal respiratory effort.    Heart:  +S1, +S2, Regular rate and rhythm, No edema. Abdomen:  Soft, nontender and nondistended. Normal bowel sounds, without guarding, and without rebound.   Neurologic:  Alert and  oriented x4;  grossly normal neurologically.  Impression/Plan: Debbie Chavez is here for an EGD for barrett's biopsies.  Risks, benefits, limitations, and alternatives regarding the procedure have been reviewed with the patient.  Questions have been answered.  All parties agreeable.   Debbie Manifold, MD  02/12/2018, 8:03 AM

## 2018-02-13 ENCOUNTER — Encounter: Payer: Self-pay | Admitting: Gastroenterology

## 2018-02-15 LAB — SURGICAL PATHOLOGY

## 2018-03-31 ENCOUNTER — Ambulatory Visit: Payer: Medicare Other | Admitting: Gastroenterology

## 2018-05-01 ENCOUNTER — Ambulatory Visit (INDEPENDENT_AMBULATORY_CARE_PROVIDER_SITE_OTHER): Payer: Medicare Other | Admitting: Gastroenterology

## 2018-05-01 ENCOUNTER — Encounter: Payer: Self-pay | Admitting: Gastroenterology

## 2018-05-01 VITALS — BP 132/79 | HR 66 | Ht 64.0 in | Wt 170.8 lb

## 2018-05-01 DIAGNOSIS — K2270 Barrett's esophagus without dysplasia: Secondary | ICD-10-CM | POA: Diagnosis not present

## 2018-05-01 MED ORDER — PANTOPRAZOLE SODIUM 20 MG PO TBEC: 20.0000 mg | DELAYED_RELEASE_TABLET | Freq: Every day | ORAL | 0 refills | Status: DC

## 2018-05-01 MED ORDER — PANTOPRAZOLE SODIUM 40 MG PO TBEC: 20.0000 mg | DELAYED_RELEASE_TABLET | Freq: Every day | ORAL | 0 refills | Status: DC

## 2018-05-01 NOTE — Progress Notes (Addendum)
[name], MD [address]  [place]  [place], [place] [address]  Main: [phone]  Fax: [phone]   Primary Care Physician: Sofie Hartigan, MD  Primary Gastroenterologist:  Dr. [name]  Chief complaint: Follow-up for Barrett's and GERD   HPI: Debbie Chavez is a 67 y.o. female with history of Barrett's here for follow-up.  Is taking Protonix 40 mg once daily and denies any breakthrough symptoms.  No dysphagia.  No loss of appetite.  No weight loss.  initially seen for isolated episode of abdominal pain, and abnormal CT scan showing duodenal thickening.  She has since undergone EGD and colonoscopy.  EGD showed a normal duodenum.  Erythematous mucosa in the antrum.  1 cm hiatal hernia.  Barrett's esophagus with circumferential Barrett's from 37 to 38 cm and maximal extent to 35 cm.  C1 M2 Barrett's.  Biopsies did not show any dysplasia.  Also underwent repeat EGD in August 2019 for Barrett's biopsies, which did not show any dysplasia.  Colonoscopy showed 2, 3 to 4 mm polyps in the ascending colon.  Removed with cold biopsy forceps.  Taken for the ileocecal valve, biopsied.  Diverticulosis.  Polypoid ileocolonic mucosa with lymphoid aggregate was seen on the ileocecal valve biopsies.  Tubular adenoma was seen on the polyps.  Repeat colonoscopy recommended in 5 years.  Current Outpatient Medications  Medication Sig Dispense Refill  . Cyanocobalamin (VITAMIN B-12 PO) Take by mouth daily.    . diclofenac (VOLTAREN) 75 MG EC tablet     . IRON PO Take by mouth daily.    Marland Kitchen lisinopril (PRINIVIL,ZESTRIL) 10 MG tablet     . Multiple Vitamin (MULTIVITAMIN) tablet Take 1 tablet by mouth daily.    . pantoprazole (PROTONIX) 40 MG tablet Take 1 tablet (40 mg total) by mouth daily. 30 tablet 0  . traMADol (ULTRAM) 50 MG tablet Take by mouth.    Marland Kitchen VITAMIN E PO Take by mouth daily.     No current facility-administered medications for this visit.     Allergies as  of 05/01/2018 - Review Complete 02/12/2018  Allergen Reaction Noted  . Meperidine Other (See Comments) 07/09/2017    ROS:  General: Negative for anorexia, weight loss, fever, chills, fatigue, weakness. ENT: Negative for hoarseness, difficulty swallowing , nasal congestion. CV: Negative for chest pain, angina, palpitations, dyspnea on exertion, peripheral edema.  Respiratory: Negative for dyspnea at rest, dyspnea on exertion, cough, sputum, wheezing.  GI: See history of present illness. GU:  Negative for dysuria, hematuria, urinary incontinence, urinary frequency, nocturnal urination.  Endo: Negative for unusual weight change.    Physical Examination:   There were no vitals taken for this visit.  General: Well-nourished, well-developed in no acute distress.  Eyes: No icterus. Conjunctivae pink. Mouth: Oropharyngeal mucosa moist and pink , no lesions erythema or exudate. Neck: Supple, Trachea midline Abdomen: Bowel sounds are normal, nontender, nondistended, no hepatosplenomegaly or masses, no abdominal bruits or hernia , no rebound or guarding.   Extremities: No lower extremity edema. No clubbing or deformities. Neuro: Alert and oriented x 3.  Grossly intact. Skin: Warm and dry, no jaundice.   Psych: Alert and cooperative, normal mood and affect.   Labs: CMP     Component Value Date/Time   NA 140 07/09/2017 0631   K 4.2 07/09/2017 0631   CL 104 07/09/2017 0631   CO2 25 07/09/2017 0631   GLUCOSE 117 (H) 07/09/2017 0631   BUN 34 (H) 07/09/2017 0631  CREATININE 1.25 (H) 07/09/2017 0631   CREATININE 1.08 08/14/2011 0932   CALCIUM 10.0 07/09/2017 0631   PROT 7.8 07/09/2017 0631   PROT 7.1 08/14/2011 0932   ALBUMIN 4.6 07/09/2017 0631   ALBUMIN 4.2 08/14/2011 0932   AST 32 07/09/2017 0631   AST 25 08/14/2011 0932   ALT 24 07/09/2017 0631   ALT 33 08/14/2011 0932   ALKPHOS 90 07/09/2017 0631   ALKPHOS 107 08/14/2011 0932   BILITOT 1.2 07/09/2017 0631   BILITOT 1.0  08/28/2011 0833   GFRNONAA 44 (L) 07/09/2017 0631   GFRNONAA 55 (L) 08/14/2011 0932   GFRAA 51 (L) 07/09/2017 0631   GFRAA >60 08/14/2011 0932   Lab Results  Component Value Date   WBC 12.1 (H) 07/09/2017   HGB 12.9 07/09/2017   HCT 38.8 07/09/2017   MCV 94.4 07/09/2017   PLT 277 07/09/2017    Imaging Studies: No results found.  Assessment and Plan:   Debbie Chavez is a 67 y.o. y/o female with Barrett's esophagus here for follow-up  No further abdominal pain No heartburn or dysphagia No alarm symptoms We will decrease Protonix from 40 to 20 mg once daily and patient is agreeable with this plan She states she already has 40 mg pills as a 90-day supply at home and does not want to discard them and would rather cut them in half.  This is reasonable, take 20 mg once daily  She states she was recently diagnosed with osteoporosis and is being started on medications.  Discussed the risks of PPI use associated with bone loss.  However, given her C1 M2 Barrett's esophagus, discontinuing PPI completely can lead to worsening of Barrett's and we discussed the benefits and risks of continuing the medication at a lower dose and she would like to continue the PT at this time. (Risks of PPI use were discussed with patient including bone loss, C. Diff diarrhea, pneumonia, infections, CKD, electrolyte abnormalities.  If clinically possible based on symptoms, goal would be to maintain patient on the lowest dose possible, or discontinue the medication with institution of acid reflux lifestyle modifications over time. Pt. Verbalizes understanding and chooses to continue the medication.)  In addition, recent study "The incidence of osteoporotic fractures was not increased in Barrett's oesophagus patients compared to the general population. Additionally, PPI use was not associated with increased fracture risk regardless of the duration of therapy or dose. ([employer])"    Patient educated extensively on acid reflux lifestyle modification, including buying a bed wedge, not eating 3 hrs before bedtime, diet modifications, and handout given for the same.   We extensively discussed options of Barrett's surveillance in 2 to 3 years with repeat biopsies versus no further surveillance and she chooses to years.  Surveillance colonoscopy due in February 2024    Dr [name]

## 2018-06-10 DIAGNOSIS — M810 Age-related osteoporosis without current pathological fracture: Secondary | ICD-10-CM | POA: Insufficient documentation

## 2019-04-22 ENCOUNTER — Other Ambulatory Visit: Payer: Self-pay | Admitting: Gastroenterology

## 2019-04-22 NOTE — Telephone Encounter (Signed)
Pt left vm she states she needs to speak with someone regarding a [facility] she states she thought her PCP could fill it but he told her  Dr. Bonna Gains needed to.

## 2019-04-22 NOTE — Telephone Encounter (Signed)
Patient states at last visit we decreased her pantoprazole to 20mg  instead of 40mg . She states she has been taking a 1/2 tablet of the 40mg  and now is almost out. Patient needs a refill on pantoprazole 20mg  [facility]

## 2019-04-23 MED ORDER — PANTOPRAZOLE SODIUM 20 MG PO TBEC: 20.0000 mg | DELAYED_RELEASE_TABLET | Freq: Every day | ORAL | 0 refills | Status: DC

## 2019-07-13 ENCOUNTER — Other Ambulatory Visit: Payer: Self-pay | Admitting: Gastroenterology

## 2019-08-06 ENCOUNTER — Telehealth: Payer: Self-pay | Admitting: Gastroenterology

## 2019-08-06 ENCOUNTER — Other Ambulatory Visit: Payer: Self-pay

## 2019-08-06 DIAGNOSIS — K2270 Barrett's esophagus without dysplasia: Secondary | ICD-10-CM

## 2019-08-06 NOTE — Telephone Encounter (Signed)
Called patient and got patient a appointment for 09/14/2019 for a EGD. Went over instructions with patient. Sent to [facility] and mailed them.

## 2019-08-06 NOTE — Telephone Encounter (Signed)
Patient called & l/m on v/m she had received a letter to schedule a colonoscopy in March with Dr Bonna Gains.

## 2019-08-06 NOTE — Telephone Encounter (Signed)
Can you call patient if you have time.

## 2019-08-07 NOTE — Addendum Note (Signed)
Addended by: Ulyess Blossom L on: 08/07/2019 08:32 AM   Modules accepted: Orders

## 2019-08-09 ENCOUNTER — Ambulatory Visit: Payer: Medicare Other | Attending: Family Medicine

## 2019-08-09 DIAGNOSIS — Z23 Encounter for immunization: Secondary | ICD-10-CM | POA: Insufficient documentation

## 2019-08-09 NOTE — Progress Notes (Signed)
   Covid-19 Vaccination Clinic  Name:  Debbie Chavez    MRN: [id number] DOB: 1951-04-15  08/09/2019  [name] was observed post Covid-19 immunization for 15 minutes without incidence. She was provided with Vaccine Information Sheet and instruction to access the V-Safe system.   [name] was instructed to call 911 with any severe reactions post vaccine: Marland Kitchen Difficulty breathing  . Swelling of your face and throat  . A fast heartbeat  . A bad rash all over your body  . Dizziness and weakness    Immunizations Administered    Name Date Dose VIS Date Route   Pfizer COVID-19 Vaccine 08/09/2019 11:31 AM 0.3 mL 06/05/2019 Intramuscular   Manufacturer: [place]   Lot: [id number]   [place]: [id number]

## 2019-08-17 ENCOUNTER — Ambulatory Visit: Admit: 2019-08-17 | Payer: Medicare Other | Admitting: Gastroenterology

## 2019-08-17 SURGERY — ESOPHAGOGASTRODUODENOSCOPY (EGD) WITH PROPOFOL
Anesthesia: Choice

## 2019-09-08 ENCOUNTER — Other Ambulatory Visit: Payer: Self-pay

## 2019-09-08 ENCOUNTER — Encounter: Payer: Self-pay | Admitting: Gastroenterology

## 2019-09-08 ENCOUNTER — Ambulatory Visit: Payer: Medicare Other | Attending: Internal Medicine

## 2019-09-08 DIAGNOSIS — Z23 Encounter for immunization: Secondary | ICD-10-CM

## 2019-09-08 NOTE — Progress Notes (Signed)
   Covid-19 Vaccination Clinic  Name:  Debbie Chavez    MRN: [id number] DOB: Jan 29, 1951  09/08/2019  [name] was observed post Covid-19 immunization for 15 minutes without incident. She was provided with Vaccine Information Sheet and instruction to access the V-Safe system.   [name] was instructed to call 911 with any severe reactions post vaccine: Marland Kitchen Difficulty breathing  . Swelling of face and throat  . A fast heartbeat  . A bad rash all over body  . Dizziness and weakness   Immunizations Administered    Name Date Dose VIS Date Route   Pfizer COVID-19 Vaccine 09/08/2019  1:45 PM 0.3 mL 06/05/2019 Intramuscular   Manufacturer: [place]   Lot: [id number]   [place]: [id number]

## 2019-09-10 ENCOUNTER — Other Ambulatory Visit
Admission: RE | Admit: 2019-09-10 | Discharge: 2019-09-10 | Disposition: A | Discharge status: Home / Self Care | Payer: Medicare Other | Source: Ambulatory Visit | Attending: Gastroenterology | Admitting: Gastroenterology

## 2019-09-10 DIAGNOSIS — Z01812 Encounter for preprocedural laboratory examination: Secondary | ICD-10-CM | POA: Insufficient documentation

## 2019-09-10 DIAGNOSIS — Z20822 Contact with and (suspected) exposure to covid-19: Secondary | ICD-10-CM | POA: Diagnosis not present

## 2019-09-10 LAB — SARS CORONAVIRUS 2 (TAT 6-24 HRS): SARS Coronavirus 2: NEGATIVE

## 2019-09-11 NOTE — Discharge Instructions (Signed)
General Anesthesia, Adult, Care After This sheet gives you information about how to care for yourself after your procedure. Your health care provider may also give you more specific instructions. If you have problems or questions, contact your health care provider. What can I expect after the procedure? After the procedure, the following side effects are common:  Pain or discomfort at the IV site.  Nausea.  Vomiting.  Sore throat.  Trouble concentrating.  Feeling cold or chills.  Weak or tired.  Sleepiness and fatigue.  Soreness and body aches. These side effects can affect parts of the body that were not involved in surgery. Follow these instructions at home:  For at least 24 hours after the procedure:  Have a responsible adult stay with you. It is important to have someone help care for you until you are awake and alert.  Rest as needed.  Do not: ? Participate in activities in which you could fall or become injured. ? Drive. ? Use heavy machinery. ? Drink alcohol. ? Take sleeping pills or medicines that cause drowsiness. ? Make important decisions or sign legal documents. ? Take care of children on your own. Eating and drinking  Follow any instructions from your health care provider about eating or drinking restrictions.  When you feel hungry, start by eating small amounts of foods that are soft and easy to digest (bland), such as toast. Gradually return to your regular diet.  Drink enough fluid to keep your urine pale yellow.  If you vomit, rehydrate by drinking water, juice, or clear broth. General instructions  If you have sleep apnea, surgery and certain medicines can increase your risk for breathing problems. Follow instructions from your health care provider about wearing your sleep device: ? Anytime you are sleeping, including during daytime naps. ? While taking prescription pain medicines, sleeping medicines, or medicines that make you drowsy.  Return to  your normal activities as told by your health care provider. Ask your health care provider what activities are safe for you.  Take over-the-counter and prescription medicines only as told by your health care provider.  If you smoke, do not smoke without supervision.  Keep all follow-up visits as told by your health care provider. This is important. Contact a health care provider if:  You have nausea or vomiting that does not get better with medicine.  You cannot eat or drink without vomiting.  You have pain that does not get better with medicine.  You are unable to pass urine.  You develop a skin rash.  You have a fever.  You have redness around your IV site that gets worse. Get help right away if:  You have difficulty breathing.  You have chest pain.  You have blood in your urine or stool, or you vomit blood. Summary  After the procedure, it is common to have a sore throat or nausea. It is also common to feel tired.  Have a responsible adult stay with you for the first 24 hours after general anesthesia. It is important to have someone help care for you until you are awake and alert.  When you feel hungry, start by eating small amounts of foods that are soft and easy to digest (bland), such as toast. Gradually return to your regular diet.  Drink enough fluid to keep your urine pale yellow.  Return to your normal activities as told by your health care provider. Ask your health care provider what activities are safe for you. This information is not   intended to replace advice given to you by your health care provider. Make sure you discuss any questions you have with your health care provider. Document Revised: 06/14/2017 Document Reviewed: 01/25/2017 Elsevier Patient Education  2020 Elsevier Inc.  

## 2019-09-14 ENCOUNTER — Encounter: Payer: Self-pay | Admitting: Gastroenterology

## 2019-09-14 ENCOUNTER — Ambulatory Visit
Admission: RE | Admit: 2019-09-14 | Discharge: 2019-09-14 | Disposition: A | Discharge status: Home / Self Care | Payer: Medicare Other | Attending: Gastroenterology | Admitting: Gastroenterology

## 2019-09-14 ENCOUNTER — Ambulatory Visit: Payer: Medicare Other | Admitting: Anesthesiology

## 2019-09-14 ENCOUNTER — Encounter
Admission: RE | Disposition: A | Discharge status: Home / Self Care | Payer: Self-pay | Source: Home / Self Care | Attending: Gastroenterology

## 2019-09-14 DIAGNOSIS — K449 Diaphragmatic hernia without obstruction or gangrene: Secondary | ICD-10-CM | POA: Insufficient documentation

## 2019-09-14 DIAGNOSIS — D649 Anemia, unspecified: Secondary | ICD-10-CM | POA: Insufficient documentation

## 2019-09-14 DIAGNOSIS — Z86010 Personal history of colonic polyps: Secondary | ICD-10-CM | POA: Diagnosis not present

## 2019-09-14 DIAGNOSIS — Z853 Personal history of malignant neoplasm of breast: Secondary | ICD-10-CM | POA: Insufficient documentation

## 2019-09-14 DIAGNOSIS — M1990 Unspecified osteoarthritis, unspecified site: Secondary | ICD-10-CM | POA: Diagnosis not present

## 2019-09-14 DIAGNOSIS — K2270 Barrett's esophagus without dysplasia: Secondary | ICD-10-CM | POA: Diagnosis present

## 2019-09-14 DIAGNOSIS — I10 Essential (primary) hypertension: Secondary | ICD-10-CM | POA: Diagnosis not present

## 2019-09-14 DIAGNOSIS — Z888 Allergy status to other drugs, medicaments and biological substances status: Secondary | ICD-10-CM | POA: Insufficient documentation

## 2019-09-14 DIAGNOSIS — Z9660 Presence of unspecified orthopedic joint implant: Secondary | ICD-10-CM | POA: Insufficient documentation

## 2019-09-14 DIAGNOSIS — Z803 Family history of malignant neoplasm of breast: Secondary | ICD-10-CM | POA: Insufficient documentation

## 2019-09-14 DIAGNOSIS — K219 Gastro-esophageal reflux disease without esophagitis: Secondary | ICD-10-CM | POA: Insufficient documentation

## 2019-09-14 HISTORY — PX: ESOPHAGOGASTRODUODENOSCOPY (EGD) WITH PROPOFOL: SHX5813

## 2019-09-14 SURGERY — ESOPHAGOGASTRODUODENOSCOPY (EGD) WITH PROPOFOL
Anesthesia: General | Site: Throat

## 2019-09-14 MED ORDER — ACETAMINOPHEN 325 MG PO TABS: 325.0000 mg | ORAL_TABLET | ORAL | Status: DC | PRN

## 2019-09-14 MED ORDER — PROPOFOL 10 MG/ML IV BOLUS
INTRAVENOUS | Status: DC | PRN
  Administered 2019-09-14: 100 mg via INTRAVENOUS
  Administered 2019-09-14: 30 mg via INTRAVENOUS
  Administered 2019-09-14: 25 mg via INTRAVENOUS
  Administered 2019-09-14: 30 mg via INTRAVENOUS

## 2019-09-14 MED ORDER — ACETAMINOPHEN 160 MG/5ML PO SOLN: 325.0000 mg | ORAL | Status: DC | PRN

## 2019-09-14 MED ORDER — LIDOCAINE HCL (CARDIAC) PF 100 MG/5ML IV SOSY
PREFILLED_SYRINGE | INTRAVENOUS | Status: DC | PRN
  Administered 2019-09-14: 40 mg via INTRAVENOUS

## 2019-09-14 MED ORDER — OXYCODONE HCL 5 MG/5ML PO SOLN: 5.0000 mg | Freq: Once | ORAL | Status: DC | PRN

## 2019-09-14 MED ORDER — STERILE WATER FOR IRRIGATION IR SOLN
Status: DC | PRN
  Administered 2019-09-14: 25 mL

## 2019-09-14 MED ORDER — GLYCOPYRROLATE 0.2 MG/ML IJ SOLN
INTRAMUSCULAR | Status: DC | PRN
  Administered 2019-09-14: .2 mg via INTRAVENOUS

## 2019-09-14 MED ORDER — LACTATED RINGERS IV SOLN: INTRAVENOUS | Status: DC

## 2019-09-14 MED ORDER — OXYCODONE HCL 5 MG PO TABS: 5.0000 mg | ORAL_TABLET | Freq: Once | ORAL | Status: DC | PRN

## 2019-09-14 SURGICAL SUPPLY — 7 items
BLOCK BITE 60FR ADLT L/F GRN (MISCELLANEOUS) ×3 IMPLANT
CANISTER SUCT 1200ML W/VALVE (MISCELLANEOUS) ×3 IMPLANT
FORCEPS BIOP RAD 4 LRG CAP 4 (CUTTING FORCEPS) ×3 IMPLANT
GOWN CVR UNV OPN BCK APRN NK (MISCELLANEOUS) ×2 IMPLANT
GOWN ISOL THUMB LOOP REG UNIV (MISCELLANEOUS) ×4
KIT ENDO PROCEDURE OLY (KITS) ×3 IMPLANT
WATER STERILE IRR 250ML POUR (IV SOLUTION) ×3 IMPLANT

## 2019-09-14 NOTE — Transfer of Care (Signed)
Immediate Anesthesia Transfer of Care Note  Patient: Debbie Chavez  Procedure(s) Performed: ESOPHAGOGASTRODUODENOSCOPY (EGD) WITH BIOPSY (N/A Throat)  Patient Location: PACU  Anesthesia Type: General  Level of Consciousness: awake, alert  and patient cooperative  Airway and Oxygen Therapy: Patient Spontanous Breathing and Patient connected to supplemental oxygen  Post-op Assessment: Post-op Vital signs reviewed, Patient's Cardiovascular Status Stable, Respiratory Function Stable, Patent Airway and No signs of Nausea or vomiting  Post-op Vital Signs: Reviewed and stable  Complications: No apparent anesthesia complications

## 2019-09-14 NOTE — H&P (Signed)
Lucilla Lame, MD [address] [place], [place] [address] [phone] Fax : [phone]  Primary Care Physician:  Sofie Hartigan, MD Primary Gastroenterologist:  Dr. Allen Norris  Pre-Procedure History & Physical: HPI:  Debbie Chavez is a 69 y.o. female is here for an endoscopy.   Past Medical History:  Diagnosis Date  . Anemia   . Arthritis    hands, knees  . Cancer ([place]) 06/2007   right breast   . GERD (gastroesophageal reflux disease)   . History of hiatal hernia   . Hypertension     Past Surgical History:  Procedure Laterality Date  . BREAST SURGERY Right 2007   lumpectomy  . COLONOSCOPY    . COLONOSCOPY WITH PROPOFOL N/A 08/20/2017   Procedure: COLONOSCOPY WITH PROPOFOL;  Surgeon: Virgel Manifold, MD;  Location: [facility];  Service: Endoscopy;  Laterality: N/A;  . ESOPHAGOGASTRODUODENOSCOPY (EGD) WITH PROPOFOL N/A 08/20/2017   Procedure: ESOPHAGOGASTRODUODENOSCOPY (EGD) WITH PROPOFOL;  Surgeon: Virgel Manifold, MD;  Location: [facility];  Service: Endoscopy;  Laterality: N/A;  . ESOPHAGOGASTRODUODENOSCOPY (EGD) WITH PROPOFOL N/A 02/12/2018   Procedure: ESOPHAGOGASTRODUODENOSCOPY (EGD) WITH PROPOFOL;  Surgeon: Virgel Manifold, MD;  Location: [facility];  Service: Endoscopy;  Laterality: N/A;  . JOINT REPLACEMENT    . MASTECTOMY Bilateral 06/2007   no lumph nodes removed  . POLYPECTOMY  08/20/2017   Procedure: POLYPECTOMY;  Surgeon: Virgel Manifold, MD;  Location: [facility];  Service: Endoscopy;;    Prior to Admission medications   Medication Sig Start Date End Date Taking? Authorizing Provider  Calcium Carbonate-Vitamin D (OYSTER SHELL CALCIUM 500 + D) 500-125 MG-UNIT TABS Take by mouth.   Yes [provider]  Cyanocobalamin (VITAMIN B-12 PO) Take by mouth daily.   Yes [provider]  IRON PO Take by mouth daily.   Yes [provider]  Multiple Vitamin (MULTIVITAMIN)  tablet Take 1 tablet by mouth daily.   Yes [provider]  pantoprazole (PROTONIX) 20 MG tablet TAKE 1 TABLET DAILY 07/13/19  Yes [name] B, MD  traMADol (ULTRAM) 50 MG tablet Take by mouth. 03/25/17  Yes [provider]  valsartan (DIOVAN) 40 MG tablet Take 40 mg by mouth daily.   Yes [provider]  VITAMIN E PO Take by mouth daily.   Yes [provider]    Allergies as of 08/06/2019 - Review Complete 02/12/2018  Allergen Reaction Noted  . Meperidine Other (See Comments) 07/09/2017    Family History  Problem Relation Age of Onset  . Cancer Mother        breast  . Cancer Sister        breast    Social History   Socioeconomic History  . Marital status: Married    Spouse name: Not on file  . Number of children: Not on file  . Years of education: Not on file  . Highest education level: Not on file  Occupational History  . Not on file  Tobacco Use  . Smoking status: Never Smoker  . Smokeless tobacco: Never Used  Substance and Sexual Activity  . Alcohol use: Yes    Comment: rare - Holidays  . Drug use: No  . Sexual activity: Not on file  Other Topics Concern  . Not on file  Social History Narrative  . Not on file   Social Determinants of Health   Financial Resource Strain:   . Difficulty of Paying Living Expenses:  Food Insecurity:   . Worried About Charity fundraiser in the Last Year:   . Arboriculturist in the Last Year:   Transportation Needs:   . Film/video editor (Medical):   Marland Kitchen Lack of Transportation (Non-Medical):   Physical Activity:   . Days of Exercise per Week:   . Minutes of Exercise per Session:   Stress:   . Feeling of Stress :   Social Connections:   . Frequency of Communication with Friends and Family:   . Frequency of Social Gatherings with Friends and Family:   . Attends Religious Services:   . Active Member of Clubs or Organizations:   . Attends Archivist Meetings:   Marland Kitchen  Marital Status:   Intimate Partner Violence:   . Fear of Current or Ex-Partner:   . Emotionally Abused:   Marland Kitchen Physically Abused:   . Sexually Abused:     Review of Systems: See HPI, otherwise negative ROS  Physical Exam: BP 111/72   Pulse 65   Temp (!) 97.5 F (36.4 C) (Temporal)   Resp 16   Ht 5\' 3"  (1.6 m)   Wt 79.2 kg   SpO2 100%   BMI 30.93 kg/m  General:   Alert,  pleasant and cooperative in NAD Head:  Normocephalic and atraumatic. Neck:  Supple; no masses or thyromegaly. Lungs:  Clear throughout to auscultation.    Heart:  Regular rate and rhythm. Abdomen:  Soft, nontender and nondistended. Normal bowel sounds, without guarding, and without rebound.   Neurologic:  Alert and  oriented x4;  grossly normal neurologically.  Impression/Plan: [name] is here for an endoscopy to be performed for barrett's esophagus  Risks, benefits, limitations, and alternatives regarding  endoscopy have been reviewed with the patient.  Questions have been answered.  All parties agreeable.   Lucilla Lame, MD  09/14/2019, 8:52 AM

## 2019-09-14 NOTE — Anesthesia Postprocedure Evaluation (Signed)
Anesthesia Post Note  Patient: Debbie Chavez  Procedure(s) Performed: ESOPHAGOGASTRODUODENOSCOPY (EGD) WITH BIOPSY (N/A Throat)     Patient location during evaluation: PACU Anesthesia Type: General Level of consciousness: awake and alert Pain management: pain level controlled Vital Signs Assessment: post-procedure vital signs reviewed and stable Respiratory status: spontaneous breathing Cardiovascular status: stable Anesthetic complications: no    Socorro Ebron, III,  Amos Gaber D

## 2019-09-14 NOTE — Anesthesia Procedure Notes (Signed)
Procedure Name: MAC Date/Time: 09/14/2019 9:18 AM Performed by: Vanetta Shawl, CRNA Pre-anesthesia Checklist: Patient identified, Emergency Drugs available, Suction available, Timeout performed and Patient being monitored Patient Re-evaluated:Patient Re-evaluated prior to induction Oxygen Delivery Method: Nasal cannula Placement Confirmation: positive ETCO2

## 2019-09-14 NOTE — Anesthesia Preprocedure Evaluation (Signed)
Anesthesia Evaluation  Patient identified by MRN, date of birth, ID band Patient awake    Reviewed: Allergy & Precautions, H&P , NPO status , Patient's Chart, lab work & pertinent test results  History of Anesthesia Complications Negative for: history of anesthetic complications  Airway Mallampati: I  TM Distance: >3 FB Neck ROM: full    Dental no notable dental hx.    Pulmonary neg pulmonary ROS,    Pulmonary exam normal breath sounds clear to auscultation       Cardiovascular hypertension, On Medications Normal cardiovascular exam Rhythm:regular Rate:Normal     Neuro/Psych negative neurological ROS     GI/Hepatic Neg liver ROS, hiatal hernia, GERD  Medicated,  Endo/Other  negative endocrine ROS  Renal/GU negative Renal ROS  negative genitourinary   Musculoskeletal   Abdominal   Peds  Hematology  (+) Blood dyscrasia, anemia ,   Anesthesia Other Findings   Reproductive/Obstetrics                             Anesthesia Physical Anesthesia Plan  ASA: II  Anesthesia Plan: General   Post-op Pain Management:    Induction:   PONV Risk Score and Plan:   Airway Management Planned:   Additional Equipment:   Intra-op Plan:   Post-operative Plan:   Informed Consent: I have reviewed the patients History and Physical, chart, labs and discussed the procedure including the risks, benefits and alternatives for the proposed anesthesia with the patient or authorized representative who has indicated his/her understanding and acceptance.       Plan Discussed with:   Anesthesia Plan Comments:         Anesthesia Quick Evaluation

## 2019-09-14 NOTE — Op Note (Signed)
[facility] Gastroenterology Patient Name: Debbie Chavez Procedure Date: 09/14/2019 9:06 AM MRN: [id number] Account #: [id number] Date of Birth: 08-04-1950 Admit Type: Outpatient Age: 69 Room: [facility] OR ROOM 01 Gender: Female Note Status: Finalized Procedure:             Upper GI endoscopy Indications:           Follow-up of Barrett's esophagus Providers:             Lucilla Lame MD, MD Referring MD:          Sofie Hartigan (Referring MD) Medicines:             Propofol per Anesthesia Complications:         No immediate complications. Procedure:             Pre-Anesthesia Assessment:                        - Prior to the procedure, a History and Physical was                         performed, and patient medications and allergies were                         reviewed. The patient's tolerance of previous                         anesthesia was also reviewed. The risks and benefits                         of the procedure and the sedation options and risks                         were discussed with the patient. All questions were                         answered, and informed consent was obtained. Prior                         Anticoagulants: The patient has taken no previous                         anticoagulant or antiplatelet agents. ASA Grade                         Assessment: II - A patient with mild systemic disease.                         After reviewing the risks and benefits, the patient                         was deemed in satisfactory condition to undergo the                         procedure.                        After obtaining informed consent, the endoscope was  passed under direct vision. Throughout the procedure,                         the patient's blood pressure, pulse, and oxygen                         saturations were monitored continuously. The was                         introduced through the mouth, and advanced to  the                         second part of duodenum. The upper GI endoscopy was                         accomplished without difficulty. The patient tolerated                         the procedure well. Findings:      There were esophageal mucosal changes classified as Barrett's stage       C2-M3 per Prague criteria present in the lower third of the esophagus.       The maximum longitudinal extent of these mucosal changes was 4 cm in       length. Mucosa was biopsied with a cold forceps for histology.      A small hiatal hernia was present.      The stomach was normal.      The examined duodenum was normal. Impression:            - Esophageal mucosal changes classified as Barrett's                         stage C2-M3 per Prague criteria. Biopsied.                        - Small hiatal hernia.                        - Normal stomach.                        - Normal examined duodenum. Recommendation:        - Discharge patient to home.                        - Resume previous diet.                        - Continue present medications.                        - Await pathology results.                        - Repeat upper endoscopy in 3 years for surveillance                         of Barrett's esophagus. Procedure Code(s):     --- Professional ---  [id number], Esophagogastroduodenoscopy, flexible,                         transoral; with biopsy, single or multiple Diagnosis Code(s):     --- Professional ---                        K22.70, Barrett's esophagus without dysplasia CPT copyright 2019 American Medical Association. All rights reserved. The codes documented in this report are preliminary and upon coder review may  be revised to meet current compliance requirements. Lucilla Lame MD, MD 09/14/2019 9:27:37 AM This report has been signed electronically. Number of Addenda: 0 Note Initiated On: 09/14/2019 9:06 AM Total Procedure Duration: 0 hours 6 minutes 10 seconds   Estimated Blood Loss:  Estimated blood loss: none.      [facility]

## 2019-09-15 ENCOUNTER — Encounter: Payer: Self-pay | Admitting: *Deleted

## 2019-09-15 LAB — SURGICAL PATHOLOGY

## 2019-09-16 ENCOUNTER — Encounter: Payer: Self-pay | Admitting: Gastroenterology

## 2019-10-08 ENCOUNTER — Other Ambulatory Visit: Payer: Self-pay

## 2019-10-08 MED ORDER — PANTOPRAZOLE SODIUM 20 MG PO TBEC: 20.0000 mg | DELAYED_RELEASE_TABLET | Freq: Every day | ORAL | 0 refills | Status: DC

## 2019-10-08 NOTE — Telephone Encounter (Signed)
Last office visit Barrett's esophagus 05/01/2018 Last refill 07/13/2019 0 refills 90 tabs  Had EGD on 09/14/2019 Called and made appointment for patient for 11/03/2019

## 2019-10-08 NOTE — Telephone Encounter (Signed)
Patient needs a refill on pantoprazole (PROTONIX) 20 MG tablet [id number]

## 2019-10-15 ENCOUNTER — Telehealth: Payer: Self-pay | Admitting: Gastroenterology

## 2019-10-15 NOTE — Telephone Encounter (Signed)
Medication was called to [facility] in [place] on 10/08/2019 for 30 tablets. Called and left a detail message informing patient that this was called in to the pharmacy. Called the pharmacy and the medication is ready for pick up

## 2019-10-15 NOTE — Telephone Encounter (Signed)
Patient called & l/m stating she called 10 days ago & someone was going to check with DR Bonna Gains to see if she could get her medication refill with [facility]. However she is almost out. I had to call her to get the name of the medication (pantoprazole (PROTONIX) 20 MG tablet) Please call her one way or the other.

## 2019-11-02 ENCOUNTER — Other Ambulatory Visit: Payer: Self-pay

## 2019-11-03 ENCOUNTER — Other Ambulatory Visit: Payer: Self-pay

## 2019-11-03 ENCOUNTER — Encounter: Payer: Self-pay | Admitting: Gastroenterology

## 2019-11-03 ENCOUNTER — Ambulatory Visit (INDEPENDENT_AMBULATORY_CARE_PROVIDER_SITE_OTHER): Payer: Medicare Other | Admitting: Gastroenterology

## 2019-11-03 VITALS — BP 116/73 | HR 120 | Temp 97.4°F | Wt 174.8 lb

## 2019-11-03 DIAGNOSIS — K2270 Barrett's esophagus without dysplasia: Secondary | ICD-10-CM

## 2019-11-03 MED ORDER — PANTOPRAZOLE SODIUM 20 MG PO TBEC: 20.0000 mg | DELAYED_RELEASE_TABLET | Freq: Every day | ORAL | 3 refills | Status: DC

## 2019-11-03 NOTE — Progress Notes (Signed)
Debbie Antigua, MD [address]  [place],  [address]  Main: [phone]  Fax: [phone]   Primary Care Physician: Debbie Hartigan, MD   Chief Complaint  Patient presents with  . Gastroesophageal Reflux    Patient stated that she had been doing well with no complaints at this time. Patient denied dysphagia, weight loss and loss of appetite.    HPI: Debbie Chavez is a 68 y.o. female here for follow-up of Barrett's esophagus.  Patient taking Protonix 20 mg once daily, which was decreased from 40 mg once daily previously and remains asymptomatic with this.  Denies any dysphagia, breakthrough symptoms, abdominal pain, nausea or vomiting.  Regular bowel habits with soft bowel movements once a day with no blood in stool.    Last EGD with Dr. Allen Chavez in March 2021, with Barrett's esophagus biopsied every 1 cm and did not show any dysplasia.  Previous history: Initially seen for isolated episode of abdominal pain, and abnormal CT scan showing duodenal thickening.  She has since undergone EGD and colonoscopy in August 2019.  EGD showed a normal duodenum.  Erythematous mucosa in the antrum.  1 cm hiatal hernia.  Barrett's esophagus with circumferential Barrett's from 37 to 38 cm and maximal extent to 35 cm.  C1 M2 Barrett's.  Biopsies did not show any dysplasia.   Colonoscopy showed 2, 3 to 4 mm polyps in the ascending colon.  Removed with cold biopsy forceps.  Taken for the ileocecal valve, biopsied.  Diverticulosis.  Polypoid ileocolonic mucosa with lymphoid aggregate was seen on the ileocecal valve biopsies.  Tubular adenoma was seen on the polyps.  Repeat colonoscopy recommended in 5 years.  Current Outpatient Medications  Medication Sig Dispense Refill  . Calcium Carb-Cholecalciferol (OYSTER SHELL CALCIUM) 500-400 MG-UNIT TABS Take 1 tablet by mouth daily.    . Calcium Carbonate-Vitamin D (OYSTER SHELL CALCIUM 500 + D) 500-125 MG-UNIT TABS Take by mouth.      . Cyanocobalamin (VITAMIN B-12 PO) Take by mouth daily.    . diphenhydramine-acetaminophen (TYLENOL PM) 25-500 MG TABS tablet Take 1 tablet by mouth daily as needed.    . IRON PO Take by mouth daily.    . Multiple Vitamin (MULTIVITAMIN) tablet Take 1 tablet by mouth daily.    . Omega-3 Fatty Acids (FISH OIL) 1000 MG CAPS Take 1 capsule by mouth in the morning and at bedtime.    . pantoprazole (PROTONIX) 20 MG tablet Take 1 tablet (20 mg total) by mouth daily. 30 tablet 0  . predniSONE (DELTASONE) 10 MG tablet Take 1 tablet by mouth daily.    Marland Kitchen tiZANidine (ZANAFLEX) 2 MG tablet Take 2 mg by mouth at bedtime as needed.    . traMADol (ULTRAM) 50 MG tablet Take by mouth.    . valsartan (DIOVAN) 40 MG tablet Take 40 mg by mouth daily.    . Vitamin E 45 MG CAPS Take 1 capsule by mouth daily.     No current facility-administered medications for this visit.    Allergies as of 11/03/2019 - Review Complete 11/03/2019  Allergen Reaction Noted  . Lisinopril Hives 09/08/2019  . Meperidine Other (See Comments) 07/09/2017    ROS:  General: Negative for anorexia, weight loss, fever, chills, fatigue, weakness. ENT: Negative for hoarseness, difficulty swallowing , nasal congestion. CV: Negative for chest pain, angina, palpitations, dyspnea on exertion, peripheral edema.  Respiratory: Negative for dyspnea at rest, dyspnea on exertion, cough, sputum, wheezing.  GI: See  history of present illness. GU:  Negative for dysuria, hematuria, urinary incontinence, urinary frequency, nocturnal urination.  Endo: Negative for unusual weight change.    Physical Examination:   BP 116/73 (BP Location: Right Arm, Patient Position: Sitting, Cuff Size: Large)   Pulse (!) 120   Temp (!) 97.4 F (36.3 C) (Oral)   Wt 174 lb 12.8 oz (79.3 kg)   BMI 30.96 kg/m   General: Well-nourished, well-developed in no acute distress.  Eyes: No icterus. Conjunctivae pink. Mouth: Oropharyngeal mucosa moist and pink , no  lesions erythema or exudate. Neck: Supple, Trachea midline Abdomen: Bowel sounds are normal, nontender, nondistended, no hepatosplenomegaly or masses, no abdominal bruits or hernia , no rebound or guarding.   Extremities: No lower extremity edema. No clubbing or deformities. Neuro: Alert and oriented x 3.  Grossly intact. Skin: Warm and dry, no jaundice.   Psych: Alert and cooperative, normal mood and affect.   Labs: CMP     Component Value Date/Time   NA 140 07/09/2017 0631   K 4.2 07/09/2017 0631   CL 104 07/09/2017 0631   CO2 25 07/09/2017 0631   GLUCOSE 117 (H) 07/09/2017 0631   BUN 34 (H) 07/09/2017 0631   CREATININE 1.25 (H) 07/09/2017 0631   CREATININE 1.08 08/14/2011 0932   CALCIUM 10.0 07/09/2017 0631   PROT 7.8 07/09/2017 0631   PROT 7.1 08/14/2011 0932   ALBUMIN 4.6 07/09/2017 0631   ALBUMIN 4.2 08/14/2011 0932   AST 32 07/09/2017 0631   AST 25 08/14/2011 0932   ALT 24 07/09/2017 0631   ALT 33 08/14/2011 0932   ALKPHOS 90 07/09/2017 0631   ALKPHOS 107 08/14/2011 0932   BILITOT 1.2 07/09/2017 0631   BILITOT 1.0 08/28/2011 0833   GFRNONAA 44 (L) 07/09/2017 0631   GFRNONAA 55 (L) 08/14/2011 0932   GFRAA 51 (L) 07/09/2017 0631   GFRAA >60 08/14/2011 0932   Lab Results  Component Value Date   WBC 12.1 (H) 07/09/2017   HGB 12.9 07/09/2017   HCT 38.8 07/09/2017   MCV 94.4 07/09/2017   PLT 277 07/09/2017    Imaging Studies: No results found.  Assessment and Plan:   Debbie Chavez is a 69 y.o. y/o female here for follow-up of Barrett's esophagus  Patient asymptomatic on low-dose PPI  Patient educated extensively on acid reflux lifestyle modification, including buying a bed wedge, not eating 3 hrs before bedtime, diet modifications, and handout given for the same.   Continue Protonix 20 mg once daily, indefinitely as per current guidelines and recommendations for Barrett's esophagus  (Risks of PPI use were discussed with patient including bone loss, C.  Diff diarrhea, pneumonia, infections, CKD, electrolyte abnormalities.  Pt. Verbalizes understanding and chooses to continue the medication.)  In addition, recent study "The incidence of osteoporotic fractures was not increased in Barrett's oesophagus patients compared to the general population. Additionally, PPI use was not associated with increased fracture risk regardless of the duration of therapy or dose. ([email or web address])"    Dr Debbie Chavez

## 2019-11-05 ENCOUNTER — Other Ambulatory Visit: Payer: Self-pay | Admitting: Gastroenterology

## 2019-11-09 ENCOUNTER — Ambulatory Visit: Payer: Medicare Other | Attending: Obstetrics and Gynecology | Admitting: Physical Therapy

## 2019-11-09 ENCOUNTER — Other Ambulatory Visit: Payer: Self-pay

## 2019-11-09 ENCOUNTER — Encounter: Payer: Self-pay | Admitting: Physical Therapy

## 2019-11-09 DIAGNOSIS — M6281 Muscle weakness (generalized): Secondary | ICD-10-CM | POA: Diagnosis present

## 2019-11-09 DIAGNOSIS — R293 Abnormal posture: Secondary | ICD-10-CM | POA: Insufficient documentation

## 2019-11-09 DIAGNOSIS — R278 Other lack of coordination: Secondary | ICD-10-CM | POA: Insufficient documentation

## 2019-11-09 NOTE — Therapy (Signed)
[facility] [address] [place], [place], [address] Phone: [phone]   Fax:  [phone]  Physical Therapy Evaluation  Patient Details  Name: Debbie Chavez MRN: [id number] Date of Birth: 1951-02-25 Referring Provider (PT): Benjaman Kindler   Encounter Date: 11/09/2019  PT End of Session - 11/09/19 1550    Visit Number  1    Number of Visits  8    Date for PT Re-Evaluation  01/04/20    Authorization Type  IE: 11/09/2019    PT Start Time  1600    PT Stop Time  1655    PT Time Calculation (min)  55 min    Activity Tolerance  Patient tolerated treatment well    Behavior During Therapy  [facility] for tasks assessed/performed       Past Medical History:  Diagnosis Date  . Anemia   . Arthritis    hands, knees  . Cancer ([place]) 06/2007   right breast   . GERD (gastroesophageal reflux disease)   . History of hiatal hernia   . Hypertension     Past Surgical History:  Procedure Laterality Date  . BREAST SURGERY Right 2007   lumpectomy  . COLONOSCOPY    . COLONOSCOPY WITH PROPOFOL N/A 08/20/2017   Procedure: COLONOSCOPY WITH PROPOFOL;  Surgeon: Virgel Manifold, MD;  Location: [place];  Service: Endoscopy;  Laterality: N/A;  . ESOPHAGOGASTRODUODENOSCOPY (EGD) WITH PROPOFOL N/A 08/20/2017   Procedure: ESOPHAGOGASTRODUODENOSCOPY (EGD) WITH PROPOFOL;  Surgeon: Virgel Manifold, MD;  Location: [place];  Service: Endoscopy;  Laterality: N/A;  . ESOPHAGOGASTRODUODENOSCOPY (EGD) WITH PROPOFOL N/A 02/12/2018   Procedure: ESOPHAGOGASTRODUODENOSCOPY (EGD) WITH PROPOFOL;  Surgeon: Virgel Manifold, MD;  Location: [facility];  Service: Endoscopy;  Laterality: N/A;  . ESOPHAGOGASTRODUODENOSCOPY (EGD) WITH PROPOFOL N/A 09/14/2019   Procedure: ESOPHAGOGASTRODUODENOSCOPY (EGD) WITH BIOPSY;  Surgeon: Lucilla Lame, MD;  Location: [place];  Service: Endoscopy;  Laterality: N/A;  . JOINT REPLACEMENT    .  MASTECTOMY Bilateral 06/2007   no lumph nodes removed  . POLYPECTOMY  08/20/2017   Procedure: POLYPECTOMY;  Surgeon: Virgel Manifold, MD;  Location: [place];  Service: Endoscopy;;    There were no vitals filed for this visit.   [facility] PT Assessment - 11/09/19 0001      Assessment   Medical Diagnosis  Bladder and uterine prolapse    Referring Provider (PT)  Benjaman Kindler       PELVIC HEALTH PHYSICAL THERAPY EVALUATION  SCREENING Red Flags: None Have you had any night sweats? Unexplained weight loss? Saddle anesthesia? Unexplained changes in bowel or bladder habits?  Precautions: None  SUBJECTIVE  Chief Complaint: Patient notes that GYN suggested PFPT. Patient notes she sees urogyn on Thursday to determine if she is a surgical candidate for prolapse correction. Patient notes that she intends to wait at least 6 weeks after she has had her L TKA (12/14/2019). Patient notes that she does not have any pain, but she has had to push her bladder back in with occasional difficulty emptying her bladder. She has some UUI with prolonged holding. Patient notes that she had a pessary but was having difficulty with removal, so had to discontinue despite finding it useful for roughly 18 months. Patient has to lift boxes for work. Patient states that she has increased urge ot urinate with inability to empty at times.   Pertinent History:  MD NOTE: "Patient had an initial consult for her prolapse  in 07/2016 with Dr. Leonides Schanz who inserted a ring with support size #3 pessary one month later. Patient's prolapse is a Grade 2 anterior prolapse and grade 2 apical prolapse."   Obstetrical History: G3P3 Deliveries: vaginal Tearing/Episiotomy: episiotomy Birthing position: back; at least 10 hour laboring  Gynecological History: Hysterectomy: No  Pain with exam: No   Urinary History: Incontinence: Positive. Onset: February 2021 Triggers: urgency, prolonged holding, coughing, laughing,  sneezing.  Amount: Min/Mod (every other day or more). Fluid Intake: 2 x 16 oz H20, green tea and black tea caffeinated Nocturia: 0x/night Frequency of urination: every 2 hours Pain with urination: Negative Difficulty initiating urination: Positive Frequent UTI: Negative.   Gastrointestinal History: [place] Stool Chart: Type 4 (constipation ~1x/week) Frequency of BMs: 1x/day Pain with defecation: Negative Straining with defecation: Positive for occasional in relationship with iron supplements. Incontinence: Negative.  Sexual activity/pain: Pain with intercourse: Positive.   Initial penetration: No  Deep thrustingYes   Location of pain: deep internal during intercourse (5-6/10)  Current activities:  working part-time; yard Careers adviser; reading  Patient Goals:  "I don't feel like everything is going to fall out whenever it wants to." "Have a little more control over the insides of my body."  Patient perception of overall health: Fair-Good  OBJECTIVE  Mental Status Patient is oriented to person, place and time.  Recent memory is intact.  Remote memory is intact.  Attention span and concentration are intact.  Expressive speech is intact.  Patient's fund of knowledge is within normal limits for educational level.  POSTURE/OBSERVATIONS:  Lumbar lordosis: decreased Iliac crest height: R elevated Pelvic obliquity: negative Patient sits with increased hip adduction.  GAIT: Trendelenburg R: Positive L: Positive Decreased pelvic rotation and some increased lumbar stiffness during gait.  RANGE OF MOTION: deferred 2/2 to time constraints   LEFT RIGHT  Lumbar forward flexion (65):      Lumbar extension (30):     Lumbar lateral flexion (25):     Thoracic and Lumbar rotation (30 degrees):       Hip Flexion (0-125):      Hip IR (0-45):     Hip ER (0-45):     Hip Abduction (0-40):     Hip extension (0-15):       SENSATION: Grossly intact to light touch bilateral LEs as  determined by testing dermatomes L2-S2 Proprioception and hot/cold testing deferred on this date  STRENGTH: MMT  deferred 2/2 to time constraints  RLE LLE  Hip Flexion    Hip Extension    Hip Abduction     Hip Adduction     Hip ER     Hip IR     Knee Extension    Knee Flexion    Dorsiflexion     Plantarflexion (seated)     ABDOMINAL:  deferred 2/2 to time constraints Palpation: Diastasis: Scar mobility: Rib flare:   PHYSICAL PERFORMANCE MEASURES: STS: WNL  EXTERNAL PELVIC EXAM:  deferred 2/2 to time constraints Palpation: Breath coordination: Cued Lengthen: Cued Contraction: Cough:  INTERNAL VAGINAL EXAM: deferred 2/2 to time constraints Introitus Appears:  Skin integrity:  Scar mobility: Strength (PERF):  Symmetry: Palpation: Prolapse:   INTERNAL RECTAL EXAM: deferred 2/2 to time constraints Strength (PERF): Symmetry: Palpation: Prolapse:   OUTCOME MEASURES: FOTO (PFDI Prolapse 13)   ASSESSMENT Patient is a 69 year old presenting to clinic with chief complaints of bladder and uterine prolapse with urinary incontinence components. Upon examination, patient demonstrates deficits in PFM strength and coordination, posture, IAP management,  as evidenced by reliance on digital reorientation of bladder with frequency ~1x/day, urinary incontinence 3-4x/week, pressure/heaviness at the pelvis with lifting. Patient's responses on FOTO outcome measures (13) indicate minimal-moderate functional limitations/disability/distress. Patient's progress may be limited due to interrupted care 2/2 to L TKA scheduled; however, patient's motivation is advantageous. Patient was able to achieve basic understanding of PFM function during today's evaluation and responded positively to educational interventions. Patient will benefit from continued skilled therapeutic intervention to address deficits in PFM strength and coordination, posture, IAP management in order to increase function and  improve overall QOL.  EDUCATION Patient educated on prognosis, POC, and provided with HEP including: diaphragmatic breathing. Patient articulated understanding and returned demonstration. Patient will benefit from further education in order to maximize compliance and understanding for long-term therapeutic gains.  TREATMENT Neuromuscular Re-education: Patient educated on primary functions of the pelvic floor including: posture/balance, sexual pleasure, storage and elimination of waste from the body, abdominal cavity closure, and breath coordination. Patient education on gravity assisted positions for PFM strengthening and [place] for improved bladder support.      Objective measurements completed on examination: See above findings.          PT Long Term Goals - 11/09/19 1555      PT LONG TERM GOAL #1   Title  Patient will demonstrate independence with HEP in order to maximize therapeutic gains and improve carryover from physical therapy sessions to ADLs in the home and community.    Baseline  IE: not demonstrated    Time  8    Period  Weeks    Status  New    Target Date  01/04/20      PT LONG TERM GOAL #2   Title  Patient will demonstrate independent and coordinated diaphragmatic breathing in supine with a 1:2 breathing pattern for improved down-regulation of the nervous system and improved management of intra-abdominal pressures in order to increase function at home and in the community.    Baseline  IE: not demonstrated    Time  8    Period  Weeks    Status  New    Target Date  01/04/20      PT LONG TERM GOAL #3   Title  Patient will demonstrate circumferential and sequential contraction of >4/5 MMT, > 6 sec hold x10 and 5 consecutive quick flicks with </= 10 min rest between testing bouts, and relaxation of the PFM coordinated with breath for improved management of intra-abdominal pressure and normal bowel and bladder function without the presence of pain nor  incontinence in order to improve participation at home and in the community.    Baseline  IE: not demonstrated    Time  8    Period  Weeks    Status  New    Target Date  01/04/20      PT LONG TERM GOAL #4   Title  Patient will report less than 3 incidents of stress urinary incontinence over the course of 3 weeks while coughing/sneezing/laughing/prolonged activity in order to demonstrate improved PFM coordination, strength, and function for improved overall QOL.    Baseline  IE: 9x/3 weeks (3x/week)    Time  8    Period  Weeks    Status  New    Target Date  01/04/20      PT LONG TERM GOAL #5   Title  Patient will demonstrate improved function as evidenced by a score of 0 on FOTO measure for full participation in  activities at home and in the community.    Baseline  IE: 13    Time  8    Period  Weeks    Status  New    Target Date  01/04/20             Plan - 11/09/19 1550    Clinical Impression Statement  Patient is a 69 year old presenting to clinic with chief complaints of bladder and uterine prolapse with urinary incontinence components. Upon examination, patient demonstrates deficits in PFM strength and coordination, posture, IAP management, as evidenced by reliance on digital reorientation of bladder with frequency ~1x/day, urinary incontinence 3-4x/week, pressure/heaviness at the pelvis with lifting. Patient's responses on FOTO outcome measures (13) indicate minimal-moderate functional limitations/disability/distress. Patient's progress may be limited due to interrupted care 2/2 to L TKA scheduled; however, patient's motivation is advantageous. Patient was able to achieve basic understanding of PFM function during today's evaluation and responded positively to educational interventions. Patient will benefit from continued skilled therapeutic intervention to address deficits in PFM strength and coordination, posture, IAP management in order to increase function and improve overall  QOL.    Personal Factors and Comorbidities  Age;Fitness;Past/Current Experience;Comorbidity 3+;Time since onset of injury/illness/exacerbation    Comorbidities  anemia, arthritis, GERD, HTN, hx of hiatal hernia    Examination-Activity Limitations  Transfers;Reach Overhead;Squat;Sit;Sleep;Continence;Stairs;Stand;Lift    Examination-Participation Restrictions  Interpersonal Relationship;Yard Work;Laundry;Community Activity;Cleaning    Stability/Clinical Decision Making  Evolving/Moderate complexity    Clinical Decision Making  Moderate    Rehab Potential  Fair    PT Frequency  1x / week    PT Duration  8 weeks    PT Treatment/Interventions  Cryotherapy;Electrical Stimulation;Moist Heat;Neuromuscular re-education;Balance training;Therapeutic exercise;Therapeutic activities;Functional mobility training;Stair training;Gait training;Patient/family education;Manual techniques;Dry needling;Scar mobilization;Taping;Spinal Manipulations;Joint Manipulations    PT Next Visit Plan  PFM Strengthening; IAP Basics    PT Home Exercise Plan  Diaphragmatic breathing    Consulted and Agree with Plan of Care  Patient       Patient will benefit from skilled therapeutic intervention in order to improve the following deficits and impairments:  Abnormal gait, Decreased coordination, Impaired tone, Improper body mechanics, Postural dysfunction, Decreased strength, Decreased balance, Decreased activity tolerance, Decreased endurance, Difficulty walking  Visit Diagnosis: Muscle weakness (generalized)  Abnormal posture  Other lack of coordination     Problem List Patient Active Problem List   Diagnosis Date Noted  . Postmenopausal osteoporosis 06/10/2018  . Special screening for malignant neoplasms, colon   . Benign neoplasm of ascending colon   . Intestinal lump   . Diverticulosis of large intestine without diverticulitis   . Abnormal CT scan, stomach   . Barrett's esophagus without dysplasia   . Stomach  irritation   . Hiatal hernia   . Breast cancer ([place]) 07/19/2017  . Hypertension, essential, benign 07/19/2017  . Primary osteoarthritis of right knee 07/18/2017  . Anemia, unspecified 12/04/2013  . Arthritis involving multiple sites 12/04/2013  . Hyperlipidemia, unspecified 12/04/2013   Myles Gip PT, DPT [phone] 11/09/2019, 6:09 PM  [facility] [address] [place], [place], [address] Phone: [phone]   Fax:  [phone]  Name: Debbie Chavez MRN: [id number] Date of Birth: 09-12-1950

## 2019-11-16 ENCOUNTER — Other Ambulatory Visit: Payer: Self-pay

## 2019-11-16 ENCOUNTER — Ambulatory Visit: Payer: Medicare Other | Admitting: Physical Therapy

## 2019-11-16 ENCOUNTER — Encounter: Payer: Self-pay | Admitting: Physical Therapy

## 2019-11-16 DIAGNOSIS — M6281 Muscle weakness (generalized): Secondary | ICD-10-CM

## 2019-11-16 DIAGNOSIS — R278 Other lack of coordination: Secondary | ICD-10-CM

## 2019-11-16 DIAGNOSIS — R293 Abnormal posture: Secondary | ICD-10-CM

## 2019-11-16 IMAGING — CR DG CHEST 2V
2 series · 2 of 2 positions shown · non-contrast
Comparison: Radiographs August 28, 2011.

CLINICAL DATA: Vomiting.

EXAM:
CHEST  2 VIEW

[chest pa]
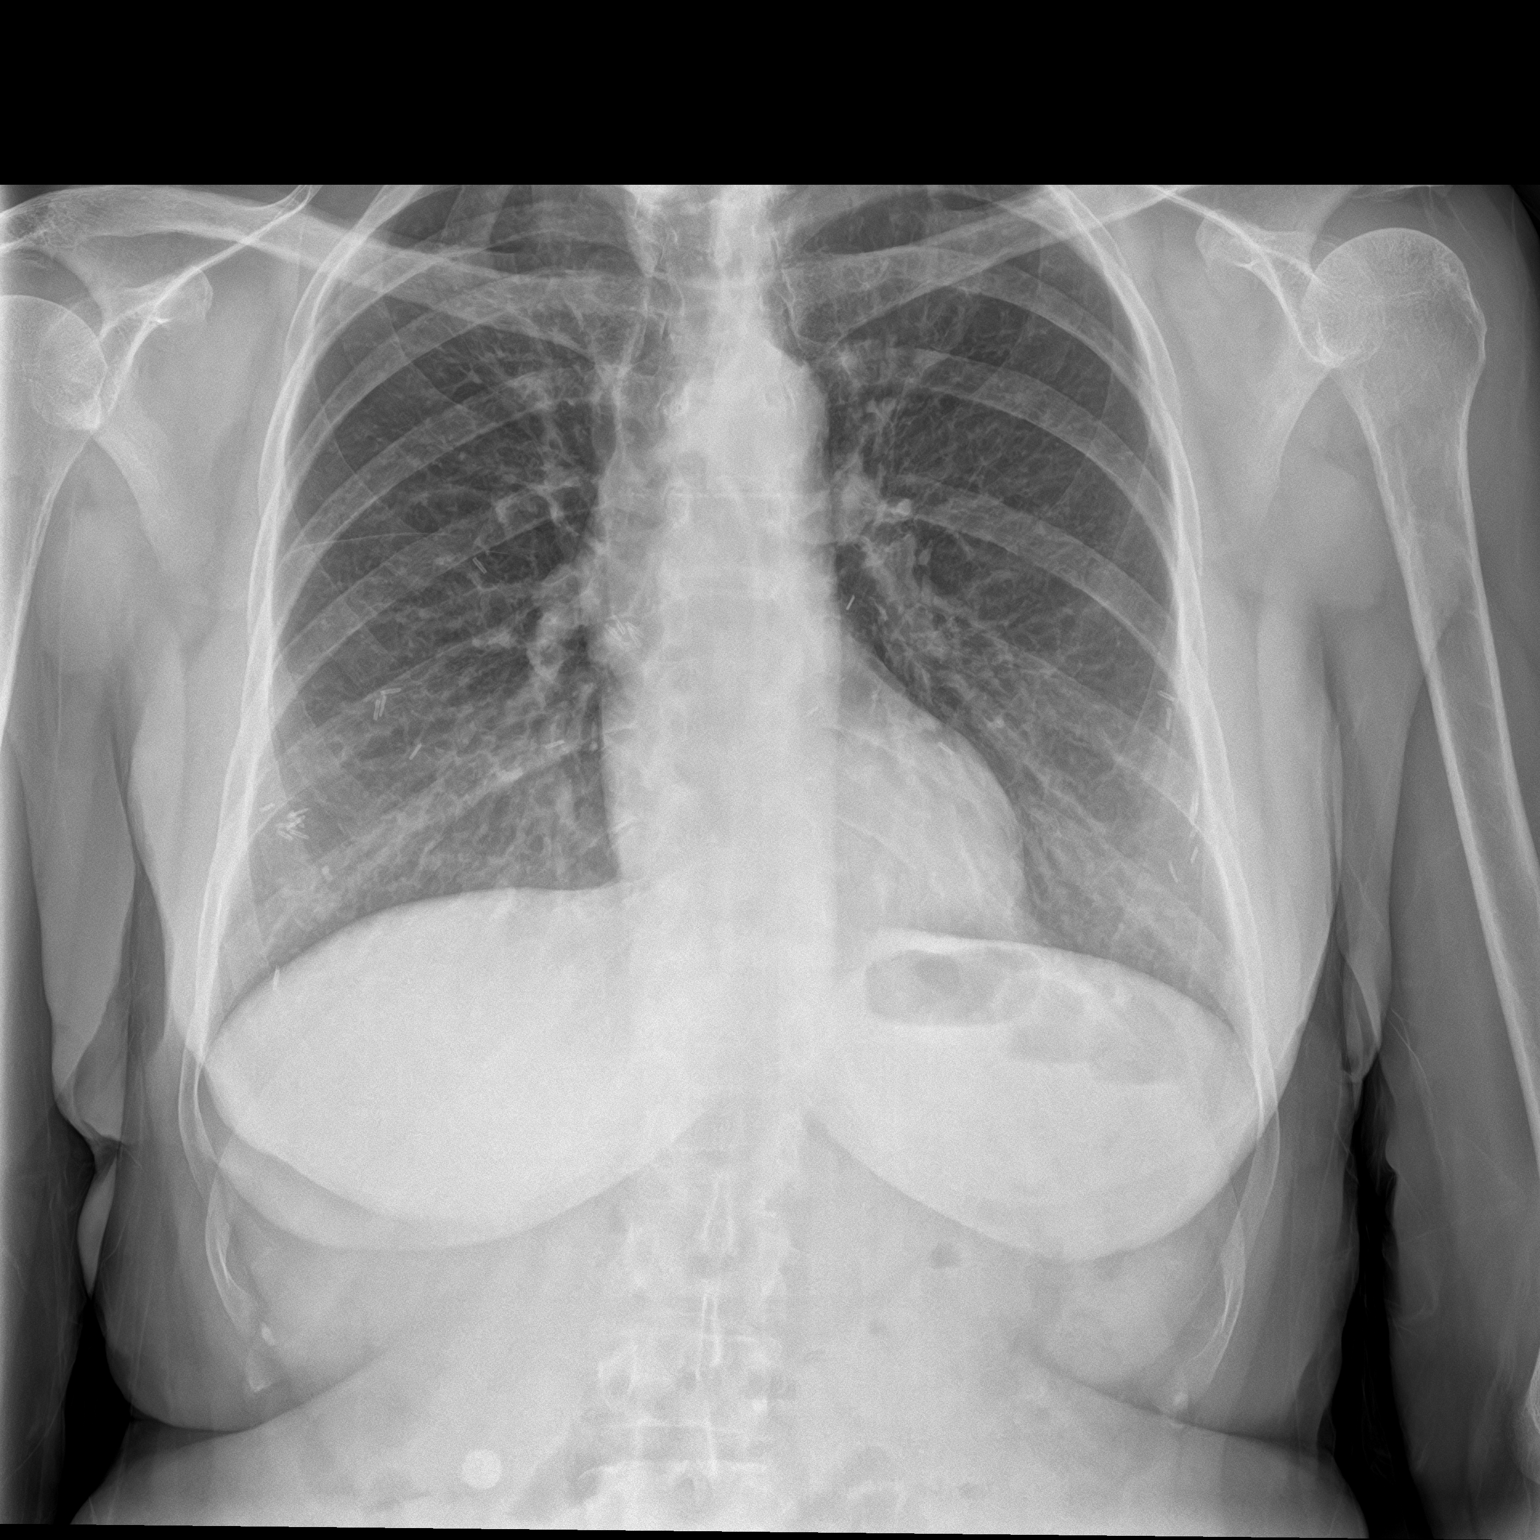

[chest lat]
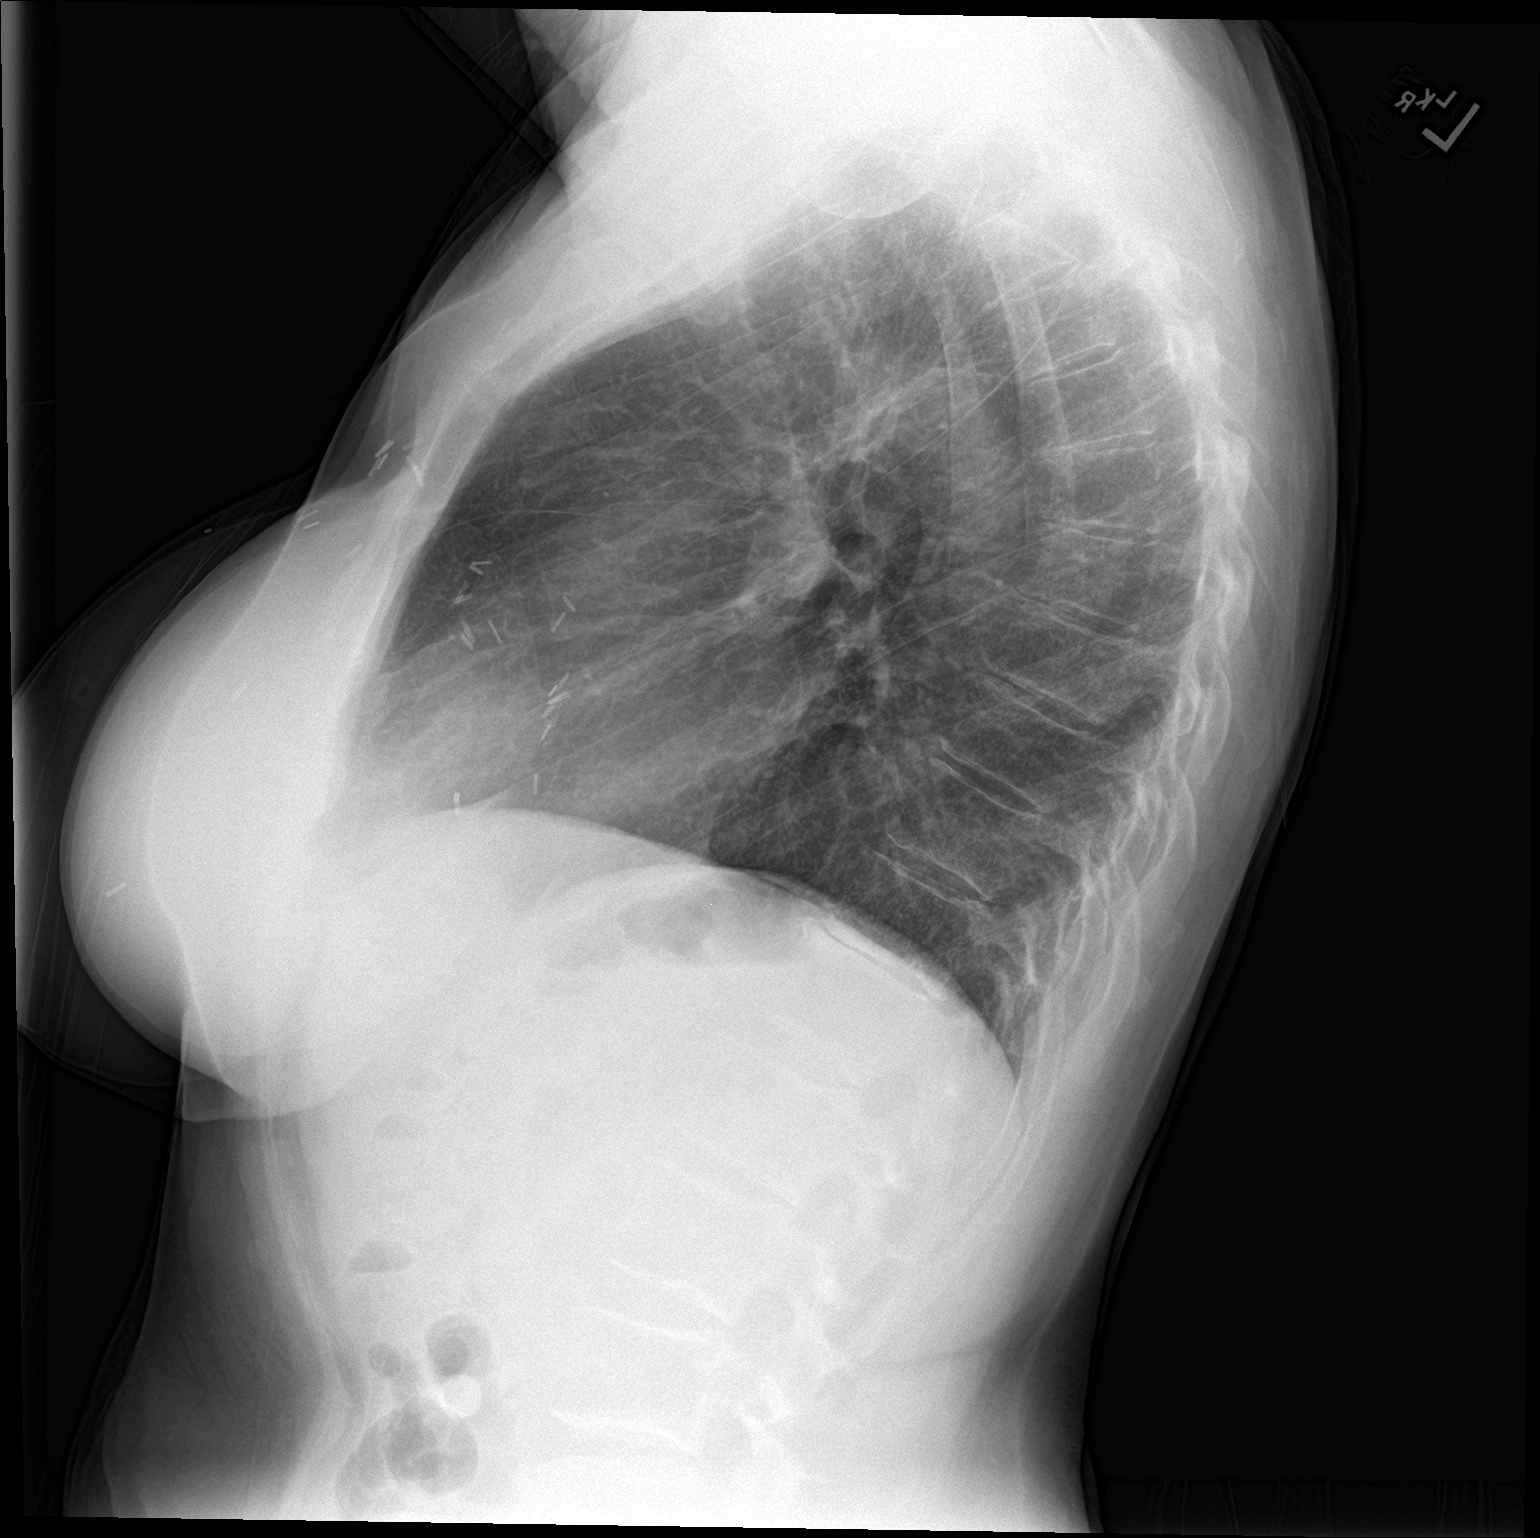

[2 of 2 positions shown; findings below may reference images not displayed]

FINDINGS: The heart size and mediastinal contours are within normal limits.
Both lungs are clear. No pneumothorax or pleural effusion is noted.
The visualized skeletal structures are unremarkable.
IMPRESSION: No active cardiopulmonary disease.

## 2019-11-16 NOTE — Therapy (Signed)
[facility] [address] [place], [place], [address] Phone: [phone]   Fax:  [phone]  Physical Therapy Treatment  Patient Details  Name: Debbie Chavez MRN: [id number] Date of Birth: 1950-10-13 Referring Provider (PT): Benjaman Kindler   Encounter Date: 11/16/2019  PT End of Session - 11/16/19 1404    Visit Number  2    Number of Visits  8    Date for PT Re-Evaluation  01/04/20    Authorization Type  IE: 11/09/2019    PT Start Time  1400    PT Stop Time  1455    PT Time Calculation (min)  55 min    Activity Tolerance  Patient tolerated treatment well    Behavior During Therapy  [facility] for tasks assessed/performed       Past Medical History:  Diagnosis Date  . Anemia   . Arthritis    hands, knees  . Cancer ([place]) 06/2007   right breast   . GERD (gastroesophageal reflux disease)   . History of hiatal hernia   . Hypertension     Past Surgical History:  Procedure Laterality Date  . BREAST SURGERY Right 2007   lumpectomy  . COLONOSCOPY    . COLONOSCOPY WITH PROPOFOL N/A 08/20/2017   Procedure: COLONOSCOPY WITH PROPOFOL;  Surgeon: Virgel Manifold, MD;  Location: [place];  Service: Endoscopy;  Laterality: N/A;  . ESOPHAGOGASTRODUODENOSCOPY (EGD) WITH PROPOFOL N/A 08/20/2017   Procedure: ESOPHAGOGASTRODUODENOSCOPY (EGD) WITH PROPOFOL;  Surgeon: Virgel Manifold, MD;  Location: [place];  Service: Endoscopy;  Laterality: N/A;  . ESOPHAGOGASTRODUODENOSCOPY (EGD) WITH PROPOFOL N/A 02/12/2018   Procedure: ESOPHAGOGASTRODUODENOSCOPY (EGD) WITH PROPOFOL;  Surgeon: Virgel Manifold, MD;  Location: [facility];  Service: Endoscopy;  Laterality: N/A;  . ESOPHAGOGASTRODUODENOSCOPY (EGD) WITH PROPOFOL N/A 09/14/2019   Procedure: ESOPHAGOGASTRODUODENOSCOPY (EGD) WITH BIOPSY;  Surgeon: Lucilla Lame, MD;  Location: [place];  Service: Endoscopy;  Laterality: N/A;  . JOINT REPLACEMENT    .  MASTECTOMY Bilateral 06/2007   no lumph nodes removed  . POLYPECTOMY  08/20/2017   Procedure: POLYPECTOMY;  Surgeon: Virgel Manifold, MD;  Location: [place];  Service: Endoscopy;;    There were no vitals filed for this visit.  Subjective Assessment - 11/16/19 1403    Subjective  Patient saw urogyn after evaluation and was made aware of the various approaches they could offer. She adds that she is tentatively scheduling a bladder tack surgery around the beginning August. Patient is awaiting scheduling.    Currently in Pain?  No/denies      TREATMENT  Pre-treatment assessment: ABDOMINAL:  Palpation: no TTP Diastasis: none noted Rib flare: L side mild  EXTERNAL PELVIC EXAM:   Palpation: no TTP Breath coordination: present Cued Lengthen: abdominal compensation with breath holding Cued Contraction: hip adductor and gluteal compensation; able to coordinate at < 50% MVC with better quality Cough: paradoxical  Neuromuscular Re-education: Patient educated extensively on intra-abdominal pressure system and impacts on pressure from, breath patterns, PFM, and core musculature. Patient educated on common times when breath holding occurs: high effort, high thought, high stress, high pain activities. Supine hooklying diaphragmatic breathing with VCs and TCs for downregulation of the nervous system and improved management of IAP Supine hooklying, PFM lengthening with inhalation. VCs and TCs to decrease compensatory patterns and encourage optimal relaxation of the PFM. Supine hooklying, PFM contractions with exhalation. VCs and TCs to decrease compensatory patterns and encourage activation of the PFM.  Seated PFM awareness activity with wash cloth feedback. VCs and TCs to decrease compensatory patterns.     Patient educated throughout session on appropriate technique and form using multi-modal cueing, HEP, and activity modification. Patient articulated understanding and returned  demonstration.  Patient Response to interventions: Patient comfortable with return in 1 week.  ASSESSMENT Patient presents to clinic with excellent motivation to participate in therapy. Patient demonstrates deficits in PFM strength and coordination, posture, IAP management. Patient able to achieve breath coordinated PFM movement  during today's session and responded positively to educational and active interventions. Patient will benefit from continued skilled therapeutic intervention to address remaining deficits in PFM strength and coordination, posture, IAP management in order to increase function and improve overall QOL.       PT Long Term Goals - 11/09/19 1555      PT LONG TERM GOAL #1   Title  Patient will demonstrate independence with HEP in order to maximize therapeutic gains and improve carryover from physical therapy sessions to ADLs in the home and community.    Baseline  IE: not demonstrated    Time  8    Period  Weeks    Status  New    Target Date  01/04/20      PT LONG TERM GOAL #2   Title  Patient will demonstrate independent and coordinated diaphragmatic breathing in supine with a 1:2 breathing pattern for improved down-regulation of the nervous system and improved management of intra-abdominal pressures in order to increase function at home and in the community.    Baseline  IE: not demonstrated    Time  8    Period  Weeks    Status  New    Target Date  01/04/20      PT LONG TERM GOAL #3   Title  Patient will demonstrate circumferential and sequential contraction of >4/5 MMT, > 6 sec hold x10 and 5 consecutive quick flicks with </= 10 min rest between testing bouts, and relaxation of the PFM coordinated with breath for improved management of intra-abdominal pressure and normal bowel and bladder function without the presence of pain nor incontinence in order to improve participation at home and in the community.    Baseline  IE: not demonstrated    Time  8    Period   Weeks    Status  New    Target Date  01/04/20      PT LONG TERM GOAL #4   Title  Patient will report less than 3 incidents of stress urinary incontinence over the course of 3 weeks while coughing/sneezing/laughing/prolonged activity in order to demonstrate improved PFM coordination, strength, and function for improved overall QOL.    Baseline  IE: 9x/3 weeks (3x/week)    Time  8    Period  Weeks    Status  New    Target Date  01/04/20      PT LONG TERM GOAL #5   Title  Patient will demonstrate improved function as evidenced by a score of 0 on FOTO measure for full participation in activities at home and in the community.    Baseline  IE: 13    Time  8    Period  Weeks    Status  New    Target Date  01/04/20            Plan - 11/16/19 1406    Clinical Impression Statement  Patient presents to clinic with excellent motivation to participate in therapy.  Patient demonstrates deficits in PFM strength and coordination, posture, IAP management. Patient able to achieve breath coordinated PFM movement  during today's session and responded positively to educational and active interventions. Patient will benefit from continued skilled therapeutic intervention to address remaining deficits in PFM strength and coordination, posture, IAP management in order to increase function and improve overall QOL.    Personal Factors and Comorbidities  Age;Fitness;Past/Current Experience;Comorbidity 3+;Time since onset of injury/illness/exacerbation    Comorbidities  anemia, arthritis, GERD, HTN, hx of hiatal hernia    Examination-Activity Limitations  Transfers;Reach Overhead;Squat;Sit;Sleep;Continence;Stairs;Stand;Lift    Examination-Participation Restrictions  Interpersonal Relationship;Yard Work;Laundry;Community Activity;Cleaning    Stability/Clinical Decision Making  Evolving/Moderate complexity    Rehab Potential  Fair    PT Frequency  1x / week    PT Duration  8 weeks    PT  Treatment/Interventions  Cryotherapy;Electrical Stimulation;Moist Heat;Neuromuscular re-education;Balance training;Therapeutic exercise;Therapeutic activities;Functional mobility training;Stair training;Gait training;Patient/family education;Manual techniques;Dry needling;Scar mobilization;Taping;Spinal Manipulations;Joint Manipulations    PT Next Visit Plan  PFM Strengthening; IAP Basics    PT Home Exercise Plan  Diaphragmatic breathing    Consulted and Agree with Plan of Care  Patient       Patient will benefit from skilled therapeutic intervention in order to improve the following deficits and impairments:  Abnormal gait, Decreased coordination, Impaired tone, Improper body mechanics, Postural dysfunction, Decreased strength, Decreased balance, Decreased activity tolerance, Decreased endurance, Difficulty walking  Visit Diagnosis: Muscle weakness (generalized)  Abnormal posture  Other lack of coordination     Problem List Patient Active Problem List   Diagnosis Date Noted  . Postmenopausal osteoporosis 06/10/2018  . Special screening for malignant neoplasms, colon   . Benign neoplasm of ascending colon   . Intestinal lump   . Diverticulosis of large intestine without diverticulitis   . Abnormal CT scan, stomach   . Barrett's esophagus without dysplasia   . Stomach irritation   . Hiatal hernia   . Breast cancer ([place]) 07/19/2017  . Hypertension, essential, benign 07/19/2017  . Primary osteoarthritis of right knee 07/18/2017  . Anemia, unspecified 12/04/2013  . Arthritis involving multiple sites 12/04/2013  . Hyperlipidemia, unspecified 12/04/2013   Myles Gip PT, DPT [phone] 11/16/2019, 3:13 PM  [facility] [address] [place], [place], [address] Phone: [phone]   Fax:  [phone]  Name: AUBRII BIRKY MRN: [id number] Date of Birth: [birth year]

## 2019-11-24 ENCOUNTER — Ambulatory Visit: Payer: Medicare Other | Attending: Obstetrics and Gynecology | Admitting: Physical Therapy

## 2019-11-24 ENCOUNTER — Encounter: Payer: Self-pay | Admitting: Physical Therapy

## 2019-11-24 ENCOUNTER — Telehealth: Payer: Self-pay | Admitting: Gastroenterology

## 2019-11-24 ENCOUNTER — Other Ambulatory Visit: Payer: Self-pay

## 2019-11-24 DIAGNOSIS — R278 Other lack of coordination: Secondary | ICD-10-CM

## 2019-11-24 DIAGNOSIS — R293 Abnormal posture: Secondary | ICD-10-CM | POA: Insufficient documentation

## 2019-11-24 DIAGNOSIS — M6281 Muscle weakness (generalized): Secondary | ICD-10-CM | POA: Insufficient documentation

## 2019-11-24 MED ORDER — PANTOPRAZOLE SODIUM 20 MG PO TBEC: 20.0000 mg | DELAYED_RELEASE_TABLET | Freq: Every day | ORAL | 1 refills | Status: DC

## 2019-11-24 MED ORDER — PANTOPRAZOLE SODIUM 20 MG PO TBEC: 20.0000 mg | DELAYED_RELEASE_TABLET | Freq: Every day | ORAL | 0 refills | Status: DC

## 2019-11-24 NOTE — Telephone Encounter (Signed)
Patient wants a call back..She said she contacted Dr. Bonna Gains on May 11th about [facility] and she only has 1 pill left

## 2019-11-24 NOTE — Therapy (Signed)
[place] [facility] [address]. [place], [place], [address] Phone: [phone]   Fax:  [phone]  Physical Therapy Treatment  Patient Details  Name: Debbie Chavez MRN: [id number] Date of Birth: [birth year] Referring Provider (PT): Benjaman Kindler   Encounter Date: 11/24/2019  PT End of Session - 11/24/19 1406    Visit Number  3    Number of Visits  8    Date for PT Re-Evaluation  01/04/20    Authorization Type  IE: 11/09/2019    PT Start Time  [id number]    PT Stop Time  1452    PT Time Calculation (min)  53 min    Activity Tolerance  Patient tolerated treatment well    Behavior During Therapy  [facility] for tasks assessed/performed       Past Medical History:  Diagnosis Date  . Anemia   . Arthritis    hands, knees  . Cancer ([place]) 06/2007   right breast   . GERD (gastroesophageal reflux disease)   . History of hiatal hernia   . Hypertension     Past Surgical History:  Procedure Laterality Date  . BREAST SURGERY Right 2007   lumpectomy  . COLONOSCOPY    . COLONOSCOPY WITH PROPOFOL N/A 08/20/2017   Procedure: COLONOSCOPY WITH PROPOFOL;  Surgeon: Virgel Manifold, MD;  Location: [place];  Service: Endoscopy;  Laterality: N/A;  . ESOPHAGOGASTRODUODENOSCOPY (EGD) WITH PROPOFOL N/A 08/20/2017   Procedure: ESOPHAGOGASTRODUODENOSCOPY (EGD) WITH PROPOFOL;  Surgeon: Virgel Manifold, MD;  Location: [place];  Service: Endoscopy;  Laterality: N/A;  . ESOPHAGOGASTRODUODENOSCOPY (EGD) WITH PROPOFOL N/A 02/12/2018   Procedure: ESOPHAGOGASTRODUODENOSCOPY (EGD) WITH PROPOFOL;  Surgeon: Virgel Manifold, MD;  Location: [facility];  Service: Endoscopy;  Laterality: N/A;  . ESOPHAGOGASTRODUODENOSCOPY (EGD) WITH PROPOFOL N/A 09/14/2019   Procedure: ESOPHAGOGASTRODUODENOSCOPY (EGD) WITH BIOPSY;  Surgeon: Lucilla Lame, MD;  Location: [place];  Service: Endoscopy;  Laterality: N/A;  . JOINT REPLACEMENT    .  MASTECTOMY Bilateral 06/2007   no lumph nodes removed  . POLYPECTOMY  08/20/2017   Procedure: POLYPECTOMY;  Surgeon: Virgel Manifold, MD;  Location: [place];  Service: Endoscopy;;    There were no vitals filed for this visit.  Subjective Assessment - 11/24/19 1400    Subjective  Patient is now scheduled for TVH and TOT on 02/04/2020. Patient notes that she is trying to be conscious of her body mechanics and the knack with postural changes/transfers. Patient notes she feels she is getting better with the kegels. Patient adds that she has noticed some fatigue in the muscles after which there is some inconsistency in repetitions.    Currently in Pain?  No/denies       TREATMENT  Neuromuscular Re-education: Patient educated extensively on intra-abdominal pressure system and impacts on pressure from, breath patterns, PFM, and core musculature. Supine hooklying diaphragmatic breathing with VCs and TCs for downregulation of the nervous system and improved management of IAP Supine hooklying, PFM contractions with exhalation; hips elevated. VCs and TCs to decrease compensatory patterns and encourage activation of the PFM. Supine hooklying, TrA activation with exhalation. VCs and TCs to decrease compensatory patterns. Standing Pilates Postural Control with coordinated breath for improved TrA and PFM awareness and endurance Hug a Tree, GTB Serve a Tray, GTB    Patient educated throughout session on appropriate technique and form using multi-modal cueing, HEP, and activity modification. Patient articulated understanding and returned demonstration.  Patient Response to  interventions: Patient remains highly motivated  ASSESSMENT Patient presents to clinic with excellent motivation to participate in therapy. Patient demonstrates deficits in PFM strength and coordination, posture, IAP management. Patient able to coordinate TrA contraction with breath in multiple postures during today's  session and responded positively to educational and active interventions. Patient will benefit from continued skilled therapeutic intervention to address remaining deficits in PFM strength and coordination, posture, IAP management in order to increase function and improve overall QOL.     PT Long Term Goals - 11/09/19 1555      PT LONG TERM GOAL #1   Title  Patient will demonstrate independence with HEP in order to maximize therapeutic gains and improve carryover from physical therapy sessions to ADLs in the home and community.    Baseline  IE: not demonstrated    Time  8    Period  Weeks    Status  New    Target Date  01/04/20      PT LONG TERM GOAL #2   Title  Patient will demonstrate independent and coordinated diaphragmatic breathing in supine with a 1:2 breathing pattern for improved down-regulation of the nervous system and improved management of intra-abdominal pressures in order to increase function at home and in the community.    Baseline  IE: not demonstrated    Time  8    Period  Weeks    Status  New    Target Date  01/04/20      PT LONG TERM GOAL #3   Title  Patient will demonstrate circumferential and sequential contraction of >4/5 MMT, > 6 sec hold x10 and 5 consecutive quick flicks with </= 10 min rest between testing bouts, and relaxation of the PFM coordinated with breath for improved management of intra-abdominal pressure and normal bowel and bladder function without the presence of pain nor incontinence in order to improve participation at home and in the community.    Baseline  IE: not demonstrated    Time  8    Period  Weeks    Status  New    Target Date  01/04/20      PT LONG TERM GOAL #4   Title  Patient will report less than 3 incidents of stress urinary incontinence over the course of 3 weeks while coughing/sneezing/laughing/prolonged activity in order to demonstrate improved PFM coordination, strength, and function for improved overall QOL.    Baseline  IE:  9x/3 weeks (3x/week)    Time  8    Period  Weeks    Status  New    Target Date  01/04/20      PT LONG TERM GOAL #5   Title  Patient will demonstrate improved function as evidenced by a score of 0 on FOTO measure for full participation in activities at home and in the community.    Baseline  IE: 13    Time  8    Period  Weeks    Status  New    Target Date  01/04/20            Plan - 11/24/19 1407    Clinical Impression Statement  Patient presents to clinic with excellent motivation to participate in therapy. Patient demonstrates deficits in PFM strength and coordination, posture, IAP management. Patient able to coordinate TrA contraction with breath in multiple postures during today's session and responded positively to educational and active interventions. Patient will benefit from continued skilled therapeutic intervention to address remaining deficits in PFM strength  and coordination, posture, IAP management in order to increase function and improve overall QOL.    Personal Factors and Comorbidities  Age;Fitness;Past/Current Experience;Comorbidity 3+;Time since onset of injury/illness/exacerbation    Comorbidities  anemia, arthritis, GERD, HTN, hx of hiatal hernia    Examination-Activity Limitations  Transfers;Reach Overhead;Squat;Sit;Sleep;Continence;Stairs;Stand;Lift    Examination-Participation Restrictions  Interpersonal Relationship;Yard Work;Laundry;Community Activity;Cleaning    Stability/Clinical Decision Making  Evolving/Moderate complexity    Rehab Potential  Fair    PT Frequency  1x / week    PT Duration  8 weeks    PT Treatment/Interventions  Cryotherapy;Electrical Stimulation;Moist Heat;Neuromuscular re-education;Balance training;Therapeutic exercise;Therapeutic activities;Functional mobility training;Stair training;Gait training;Patient/family education;Manual techniques;Dry needling;Scar mobilization;Taping;Spinal Manipulations;Joint Manipulations    PT Next Visit  Plan  PFM Strengthening; IAP Basics    PT Home Exercise Plan  Diaphragmatic breathing    Consulted and Agree with Plan of Care  Patient       Patient will benefit from skilled therapeutic intervention in order to improve the following deficits and impairments:  Abnormal gait, Decreased coordination, Impaired tone, Improper body mechanics, Postural dysfunction, Decreased strength, Decreased balance, Decreased activity tolerance, Decreased endurance, Difficulty walking  Visit Diagnosis: Muscle weakness (generalized)  Abnormal posture  Other lack of coordination     Problem List Patient Active Problem List   Diagnosis Date Noted  . Postmenopausal osteoporosis 06/10/2018  . Special screening for malignant neoplasms, colon   . Benign neoplasm of ascending colon   . Intestinal lump   . Diverticulosis of large intestine without diverticulitis   . Abnormal CT scan, stomach   . Barrett's esophagus without dysplasia   . Stomach irritation   . Hiatal hernia   . Breast cancer ([place]) 07/19/2017  . Hypertension, essential, benign 07/19/2017  . Primary osteoarthritis of right knee 07/18/2017  . Anemia, unspecified 12/04/2013  . Arthritis involving multiple sites 12/04/2013  . Hyperlipidemia, unspecified 12/04/2013   Myles Gip PT, DPT [phone] 11/24/2019, 3:54 PM  [facility] [address] [place], [place], [address] Phone: [phone]   Fax:  [phone]  Name: VALOR HUEBER MRN: [id number] Date of Birth: Oct 11, 1950

## 2019-11-24 NOTE — Telephone Encounter (Signed)
[facility] and [facility] to [facility]

## 2019-11-25 ENCOUNTER — Telehealth: Payer: Self-pay

## 2019-11-25 MED ORDER — PANTOPRAZOLE SODIUM 20 MG PO TBEC: 20.0000 mg | DELAYED_RELEASE_TABLET | Freq: Every day | ORAL | 3 refills | Status: DC

## 2019-11-25 NOTE — Telephone Encounter (Signed)
Patient called wanting her prescription to be sent to [facility]. I called her back but had to leave her a voicemail letting her know that I would send her prescription today.

## 2019-11-26 ENCOUNTER — Telehealth: Payer: Self-pay

## 2019-11-26 NOTE — Telephone Encounter (Signed)
Patient called frustrated to let me know that she had not received her Pantoprazole 20 MG from [facility] even though when she had her follow up appointment on May 11 th, we were suppose to send her refill request. I apologized to her multiple times telling her that we had made a mistake and sent it to her local pharmacy-[facility]. Patient stated that she was fed-up with our clinic and our staff and that she was not happy with the staff. I tried to continue to apologize to the patient and that it was taken care of now and that she shouldn't have any more problems with her prescription. Patient stated that she did not wanted to hear it and that it was too late. Patient requested to speak with the provider to let her know how she felt. I offered her to schedule a telephone appointment and she stated that she did not want to give [place] a penny more and then said "have a great day" and hung up. Patient's prescription for Pantoprazole 20 MG 1 cap daily for 30 days was sent to her local pharmacy since she did not have any left. Patient stated that she will not go and pick it up. Patient's prescription was also sent to [facility] with #90 caps and 3 refills. I called them and they stated that it was mailed yesterday-Oct 25, 2019 and that the patient might receive it tomorrow by mail.  Just FYI Dr. Bonna Gains.

## 2019-11-30 ENCOUNTER — Encounter: Payer: Self-pay | Admitting: Physical Therapy

## 2019-11-30 ENCOUNTER — Ambulatory Visit: Payer: Medicare Other | Admitting: Physical Therapy

## 2019-11-30 ENCOUNTER — Other Ambulatory Visit: Payer: Self-pay

## 2019-11-30 DIAGNOSIS — M6281 Muscle weakness (generalized): Secondary | ICD-10-CM

## 2019-11-30 DIAGNOSIS — R278 Other lack of coordination: Secondary | ICD-10-CM

## 2019-11-30 DIAGNOSIS — R293 Abnormal posture: Secondary | ICD-10-CM

## 2019-11-30 NOTE — Therapy (Signed)
[place] [facility] [address] [place], [place], [address] Phone: [phone]   Fax:  [phone]  Physical Therapy Treatment  Patient Details  Name: Debbie Chavez MRN: [id number] Date of Birth: 06-14-1951 Referring Provider (PT): Benjaman Kindler   Encounter Date: 11/30/2019  PT End of Session - 11/30/19 1503    Visit Number  4    Number of Visits  8    Date for PT Re-Evaluation  01/04/20    Authorization Type  IE: 11/09/2019    PT Start Time  1402    PT Stop Time  1455    PT Time Calculation (min)  53 min    Activity Tolerance  Patient tolerated treatment well    Behavior During Therapy  [facility] for tasks assessed/performed       Past Medical History:  Diagnosis Date  . Anemia   . Arthritis    hands, knees  . Cancer ([place]) 06/2007   right breast   . GERD (gastroesophageal reflux disease)   . History of hiatal hernia   . Hypertension     Past Surgical History:  Procedure Laterality Date  . BREAST SURGERY Right 2007   lumpectomy  . COLONOSCOPY    . COLONOSCOPY WITH PROPOFOL N/A 08/20/2017   Procedure: COLONOSCOPY WITH PROPOFOL;  Surgeon: Virgel Manifold, MD;  Location: [place];  Service: Endoscopy;  Laterality: N/A;  . ESOPHAGOGASTRODUODENOSCOPY (EGD) WITH PROPOFOL N/A 08/20/2017   Procedure: ESOPHAGOGASTRODUODENOSCOPY (EGD) WITH PROPOFOL;  Surgeon: Virgel Manifold, MD;  Location: [place];  Service: Endoscopy;  Laterality: N/A;  . ESOPHAGOGASTRODUODENOSCOPY (EGD) WITH PROPOFOL N/A 02/12/2018   Procedure: ESOPHAGOGASTRODUODENOSCOPY (EGD) WITH PROPOFOL;  Surgeon: Virgel Manifold, MD;  Location: [facility];  Service: Endoscopy;  Laterality: N/A;  . ESOPHAGOGASTRODUODENOSCOPY (EGD) WITH PROPOFOL N/A 09/14/2019   Procedure: ESOPHAGOGASTRODUODENOSCOPY (EGD) WITH BIOPSY;  Surgeon: Lucilla Lame, MD;  Location: [place];  Service: Endoscopy;  Laterality: N/A;  . JOINT REPLACEMENT    .  MASTECTOMY Bilateral 06/2007   no lumph nodes removed  . POLYPECTOMY  08/20/2017   Procedure: POLYPECTOMY;  Surgeon: Virgel Manifold, MD;  Location: [place];  Service: Endoscopy;;    There were no vitals filed for this visit.  Subjective Assessment - 11/30/19 1404    Subjective  Patient notes that she is feeling a good connection with standing abdominal exercises and is feeling successful. Patient notes that she has some difficulty getting the sensation of a kegel in the right position for hips elevated kegels.    Currently in Pain?  No/denies       TREATMENT Manual Therapy: STM of abdominal region for stimulation of peristalsis and bowel regulation. Patient educated on strategies for self-massage and able to demonstrate and teach back with good form and sequencing.  Neuromuscular Re-education: Patient educated on impact of anaesthesia and post-surgical pain medication on bowel function and strategies to minimize straining with BMs in order to prevent progression of prolapse.   Patient education on strategies to maintain PFM strengthening progress s/p L TKA.    Patient educated throughout session on appropriate technique and form using multi-modal cueing, HEP, and activity modification. Patient articulated understanding and returned demonstration.  Patient Response to interventions: Patient confident to self-manage until TOT procedure.  ASSESSMENT Patient presents to clinic with excellent motivation to participate in therapy. Patient demonstrates deficits in PFM strength and coordination, posture, IAP management. Patient articulating and demonstrating good understanding of techniques for PFM strengthening  and colon massage during today's session and responded positively to educational and active interventions. Patient will benefit from continued skilled therapeutic intervention to address remaining deficits in PFM strength and coordination, posture, IAP management in order  to increase function and improve overall QOL.     PT Long Term Goals - 11/30/19 1505      PT LONG TERM GOAL #1   Title  Patient will demonstrate independence with HEP in order to maximize therapeutic gains and improve carryover from physical therapy sessions to ADLs in the home and community.    Baseline  IE: not demonstrated    Time  8    Period  Weeks    Status  Achieved      PT LONG TERM GOAL #2   Title  Patient will demonstrate independent and coordinated diaphragmatic breathing in supine with a 1:2 breathing pattern for improved down-regulation of the nervous system and improved management of intra-abdominal pressures in order to increase function at home and in the community.    Baseline  IE: not demonstrated    Time  8    Period  Weeks    Status  Achieved      PT LONG TERM GOAL #3   Title  Patient will demonstrate circumferential and sequential contraction of >4/5 MMT, > 6 sec hold x10 and 5 consecutive quick flicks with </= 10 min rest between testing bouts, and relaxation of the PFM coordinated with breath for improved management of intra-abdominal pressure and normal bowel and bladder function without the presence of pain nor incontinence in order to improve participation at home and in the community.    Baseline  IE: not demonstrated    Time  8    Period  Weeks    Status  Deferred      PT LONG TERM GOAL #4   Title  Patient will report less than 3 incidents of stress urinary incontinence over the course of 3 weeks while coughing/sneezing/laughing/prolonged activity in order to demonstrate improved PFM coordination, strength, and function for improved overall QOL.    Baseline  IE: 9x/3 weeks (3x/week)    Time  8    Period  Weeks    Status  Deferred      PT LONG TERM GOAL #5   Title  Patient will demonstrate improved function as evidenced by a score of 0 on FOTO measure for full participation in activities at home and in the community.    Baseline  IE: 13    Time  8     Period  Weeks    Status  Deferred            Plan - 11/30/19 1505    Clinical Impression Statement  Patient presents to clinic with excellent motivation to participate in therapy. Patient demonstrates deficits in PFM strength and coordination, posture, IAP management. Patient articulating and demonstrating good understanding of techniques for PFM strengthening and colon massage during today's session and responded positively to educational and active interventions. Patient will benefit from continued skilled therapeutic intervention to address remaining deficits in PFM strength and coordination, posture, IAP management in order to increase function and improve overall QOL.    Personal Factors and Comorbidities  Age;Fitness;Past/Current Experience;Comorbidity 3+;Time since onset of injury/illness/exacerbation    Comorbidities  anemia, arthritis, GERD, HTN, hx of hiatal hernia    Examination-Activity Limitations  Transfers;Reach Overhead;Squat;Sit;Sleep;Continence;Stairs;Stand;Lift    Examination-Participation Restrictions  Interpersonal Relationship;Yard Work;Laundry;Community Activity;Cleaning    Stability/Clinical Decision Making  Evolving/Moderate complexity  Rehab Potential  Fair    PT Frequency  1x / week    PT Duration  8 weeks    PT Treatment/Interventions  Cryotherapy;Electrical Stimulation;Moist Heat;Neuromuscular re-education;Balance training;Therapeutic exercise;Therapeutic activities;Functional mobility training;Stair training;Gait training;Patient/family education;Manual techniques;Dry needling;Scar mobilization;Taping;Spinal Manipulations;Joint Manipulations    Consulted and Agree with Plan of Care  Patient       Patient will benefit from skilled therapeutic intervention in order to improve the following deficits and impairments:  Abnormal gait, Decreased coordination, Impaired tone, Improper body mechanics, Postural dysfunction, Decreased strength, Decreased balance,  Decreased activity tolerance, Decreased endurance, Difficulty walking  Visit Diagnosis: Muscle weakness (generalized)  Abnormal posture  Other lack of coordination     Problem List Patient Active Problem List   Diagnosis Date Noted  . Postmenopausal osteoporosis 06/10/2018  . Special screening for malignant neoplasms, colon   . Benign neoplasm of ascending colon   . Intestinal lump   . Diverticulosis of large intestine without diverticulitis   . Abnormal CT scan, stomach   . Barrett's esophagus without dysplasia   . Stomach irritation   . Hiatal hernia   . Breast cancer ([place]) 07/19/2017  . Hypertension, essential, benign 07/19/2017  . Primary osteoarthritis of right knee 07/18/2017  . Anemia, unspecified 12/04/2013  . Arthritis involving multiple sites 12/04/2013  . Hyperlipidemia, unspecified 12/04/2013   Myles Gip PT, DPT [phone] 11/30/2019, 3:46 PM  [place] [facility] [address] [place], [place], [address] Phone: [phone]   Fax:  [phone]  Name: Debbie Chavez MRN: [id number] Date of Birth: August 08, 1950

## 2019-12-01 NOTE — Discharge Instructions (Signed)
Instructions after Total Knee Replacement   Evangeline Utley P. Suhaas Agena, Jr., M.D.     Dept. of Orthopaedics & Sports Medicine  [facility]  [address]  [place], [place]  [address]  Phone: [phone]   Fax: [phone]    DIET: Drink plenty of non-alcoholic fluids. Resume your normal diet. Include foods high in fiber.  ACTIVITY:  You may use crutches or a walker with weight-bearing as tolerated, unless instructed otherwise. You may be weaned off of the walker or crutches by your Physical Therapist.  Do NOT place pillows under the knee. Anything placed under the knee could limit your ability to straighten the knee.   Continue doing gentle exercises. Exercising will reduce the pain and swelling, increase motion, and prevent muscle weakness.   Please continue to use the TED compression stockings for 6 weeks. You may remove the stockings at night, but should reapply them in the morning. Do not drive or operate any equipment until instructed.  WOUND CARE:  Continue to use the PolarCare or ice packs periodically to reduce pain and swelling. You may bathe or shower after the staples are removed at the first office visit following surgery.  MEDICATIONS: You may resume your regular medications. Please take the pain medication as prescribed on the medication. Do not take pain medication on an empty stomach. You have been given a prescription for a blood thinner (Lovenox or Coumadin). Please take the medication as instructed. (NOTE: After completing a 2 week course of Lovenox, take one Enteric-coated aspirin once a day. This along with elevation will help reduce the possibility of phlebitis in your operated leg.) Do not drive or drink alcoholic beverages when taking pain medications.  CALL THE OFFICE FOR: Temperature above 101 degrees Excessive bleeding or drainage on the dressing. Excessive swelling, coldness, or paleness of the toes. Persistent nausea and vomiting.  FOLLOW-UP:  You  should have an appointment to return to the office in 10-14 days after surgery. Arrangements have been made for continuation of Physical Therapy (either home therapy or outpatient therapy).   [facility] Department Directory         [email or web address]       [email or web address]          Cardiology  Appointments: [place] - [phone] [place] - [phone]  Endocrinology  Appointments: [place] - [phone] [place] - [phone]  Gastroenterology  Appointments: [place] - [phone] [place] - [phone]        General Surgery   Appointments: [place] - [phone]  Internal Medicine/Family Medicine  Appointments: [place] - [phone] [place] - [phone] [place] - [phone]  Metabolic and Weigh Loss Surgery  Appointments: [place] - [phone]        Neurology  Appointments: [place] - [phone] [place] - [phone]  Neurosurgery  Appointments: [place] - [phone]  Obstetrics & Gynecology  Appointments: [place] - [phone] [place] - [phone]        Pediatrics  Appointments: [place] - [phone] [place] - [phone]  Physiatry  Appointments: [place] -[phone]  Physical Therapy  Appointments: [place] - [phone] [place] - [phone]        Podiatry  Appointments: [place] - [phone] [place] - [phone]  Pulmonology  Appointments: [place] - [phone]  Rheumatology  Appointments: [place] - [phone]         Location: [facility]  [address] , [place]  [address]  [place] Location: [facility] [address] [place], [place]  [address]  [place] Location: [facility] [address] [place], [place]  [address]    

## 2019-12-03 ENCOUNTER — Other Ambulatory Visit: Payer: Self-pay

## 2019-12-03 ENCOUNTER — Encounter: Payer: Self-pay | Admitting: Orthopedic Surgery

## 2019-12-03 ENCOUNTER — Encounter
Admission: RE | Admit: 2019-12-03 | Discharge: 2019-12-03 | Disposition: A | Discharge status: Home / Self Care | Payer: Medicare Other | Source: Ambulatory Visit | Attending: Orthopedic Surgery | Admitting: Orthopedic Surgery

## 2019-12-03 DIAGNOSIS — Z01812 Encounter for preprocedural laboratory examination: Secondary | ICD-10-CM | POA: Insufficient documentation

## 2019-12-03 NOTE — Patient Instructions (Signed)
Your procedure is scheduled on:Mon 6/21 Report to Day Surgery. To find out your arrival time please call [phone] between 1PM - 3PM on Friday .6/18  Remember: Instructions that are not followed completely may result in serious medical risk,  up to and including death, or upon the discretion of your surgeon and anesthesiologist your  surgery may need to be rescheduled.     _X__ 1. Do not eat food after midnight the night before your procedure.                 No gum chewing or hard candies. You may drink clear liquids up to 2 hours                 before you are scheduled to arrive for your surgery- DO not drink clear                 liquids within 2 hours of the start of your surgery.                 Clear Liquids include:  water, apple juice without pulp, clear Gatorade, G2 or                  Gatorade Zero (avoid Red/Purple/Blue), Black Coffee or Tea (Do not add                 anything to coffee or tea). __x___2.   Complete the carbohydrate drink provided to you, 2 hours before arrival.  __X__2.  On the morning of surgery brush your teeth with toothpaste and water, you                may rinse your mouth with mouthwash if you wish.  Do not swallow any toothpaste of mouthwash.     _X__ 3.  No Alcohol for 24 hours before or after surgery.   ___ 4.  Do Not Smoke or use e-cigarettes For 24 Hours Prior to Your Surgery.                 Do not use any chewable tobacco products for at least 6 hours prior to                 Surgery.  ___  5.  Do not use any recreational drugs (marijuana, cocaine, heroin, ecstacy, MDMA or other)                For at least one week prior to your surgery.  Combination of these drugs with anesthesia                May have life threatening results.  ____  6.  Bring all medications with you on the day of surgery if instructed.   _x___  7.  Notify your doctor if there is any change in your medical condition      (cold, fever, infections).     Do not  wear jewelry, make-up, hairpins, clips or nail polish. Do not wear lotions, powders, or perfumes. You may wear deodorant. Do not shave 48 hours prior to surgery.  Do not bring valuables to the hospital.    [facility] is not responsible for any belongings or valuables.  Contacts, dentures or bridgework may not be worn into surgery. Leave your suitcase in the car. After surgery it may be brought to your room. For patients admitted to the hospital, discharge time is determined by your treatment team.   Patients discharged the  day of surgery will not be allowed to drive home.   Make arrangements for someone to be with you for the first 24 hours of your Same Day Discharge.    Please read over the following fact sheets that you were given:     _x___ Take these medicines the morning of surgery with A SIP OF WATER:    1. pantoprazole (PROTONIX) 20 MG tablet  Take dose the night before and the morning of the surgery  2. traMADol (ULTRAM) 50 MG tablet  If needed  3.   4.  5.  6.  ____ Fleet Enema (as directed)   __x__ Use CHG Soap (or wipes) as directed  ____ Use Benzoyl Peroxide Gel as instructed  ____ Use inhalers on the day of surgery  ____ Stop metformin 2 days prior to surgery    ____ Take 1/2 of usual insulin dose the night before surgery. No insulin the morning          of surgery.   ____ Stop Coumadin/Plavix/aspirin on   _x___ Stop Anti-inflammatories no Ibuprofen aleve or Aspirin after 6/14  May resume after surgery   __x__ Stop supplements. Omega-3 Fatty Acids (FISH OIL) 1200 MG CPD on 6/14.  May continue other vitamins  ____ Bring C-Pap to the hospital.

## 2019-12-07 ENCOUNTER — Encounter: Payer: Medicare Other | Admitting: Physical Therapy

## 2019-12-08 ENCOUNTER — Encounter
Admission: RE | Admit: 2019-12-08 | Discharge: 2019-12-08 | Disposition: A | Discharge status: Home / Self Care | Payer: Medicare Other | Source: Ambulatory Visit | Attending: Orthopedic Surgery | Admitting: Orthopedic Surgery

## 2019-12-08 ENCOUNTER — Other Ambulatory Visit: Payer: Self-pay

## 2019-12-08 DIAGNOSIS — R9431 Abnormal electrocardiogram [ECG] [EKG]: Secondary | ICD-10-CM | POA: Diagnosis not present

## 2019-12-08 DIAGNOSIS — Z01810 Encounter for preprocedural cardiovascular examination: Secondary | ICD-10-CM | POA: Diagnosis not present

## 2019-12-08 DIAGNOSIS — Z01818 Encounter for other preprocedural examination: Secondary | ICD-10-CM | POA: Insufficient documentation

## 2019-12-08 LAB — COMPREHENSIVE METABOLIC PANEL
ALT: 20 U/L (ref 0–44)
AST: 24 U/L (ref 15–41)
Albumin: 4.1 g/dL (ref 3.5–5.0)
Alkaline Phosphatase: 51 U/L (ref 38–126)
Anion gap: 7 (ref 5–15)
BUN: 21 mg/dL (ref 8–23)
CO2: 27 mmol/L (ref 22–32)
Calcium: 9 mg/dL (ref 8.9–10.3)
Chloride: 106 mmol/L (ref 98–111)
Creatinine, Ser: 1.05 mg/dL — ABNORMAL HIGH (ref 0.44–1.00)
GFR calc Af Amer: >60 mL/min (ref >60)
GFR calc non Af Amer: 55 mL/min — ABNORMAL LOW (ref >60)
Glucose, Bld: 87 mg/dL (ref 70–99)
Potassium: 4.1 mmol/L (ref 3.5–5.1)
Sodium: 140 mmol/L (ref 135–145)
Total Bilirubin: 1.8 mg/dL — ABNORMAL HIGH (ref 0.3–1.2)
Total Protein: 7.1 g/dL (ref 6.5–8.1)

## 2019-12-08 LAB — TYPE AND SCREEN
ABO/RH(D): B NEG
Antibody Screen: NEGATIVE

## 2019-12-08 LAB — APTT: aPTT: 32 seconds (ref 24–36)

## 2019-12-08 LAB — URINALYSIS, ROUTINE W REFLEX MICROSCOPIC
Bilirubin Urine: NEGATIVE
Glucose, UA: NEGATIVE mg/dL
Ketones, ur: NEGATIVE mg/dL
Leukocytes,Ua: NEGATIVE
Nitrite: NEGATIVE
Protein, ur: NEGATIVE mg/dL
Specific Gravity, Urine: 1.019 (ref 1.005–1.030)
pH: 5 (ref 5.0–8.0)

## 2019-12-08 LAB — CBC
HCT: 34.9 % — ABNORMAL LOW (ref 36.0–46.0)
Hemoglobin: 11.6 g/dL — ABNORMAL LOW (ref 12.0–15.0)
MCH: 32 pg (ref 26.0–34.0)
MCHC: 33.2 g/dL (ref 30.0–36.0)
MCV: 96.1 fL (ref 80.0–100.0)
Platelets: 216 10*3/uL (ref 150–400)
RBC: 3.63 MIL/uL — ABNORMAL LOW (ref 3.87–5.11)
RDW: 11.7 % (ref 11.5–15.5)
WBC: 5.4 10*3/uL (ref 4.0–10.5)
nRBC: 0 % (ref 0.0–0.2)

## 2019-12-08 LAB — PROTIME-INR
INR: 0.9 (ref 0.8–1.2)
Prothrombin Time: 11.7 seconds (ref 11.4–15.2)

## 2019-12-08 LAB — SURGICAL PCR SCREEN
MRSA, PCR: NEGATIVE
Staphylococcus aureus: NEGATIVE

## 2019-12-08 LAB — C-REACTIVE PROTEIN: CRP: 0.9 mg/dL (ref <1.0)

## 2019-12-08 LAB — SEDIMENTATION RATE: Sed Rate: 19 mm/hr (ref 0–30)

## 2019-12-09 NOTE — Pre-Procedure Instructions (Signed)
Pre-Admit Testing Provider Notification Note  Provider Notified: Dr. Marry Guan  Notification Mode: Fax  Reason: Abnormal lab result. ([facility])  Response: Fax confirmation received.   Additional Information: Placed on chart. Noted on Pre-Admit Worksheet.  Signed: Beulah Gandy, RN

## 2019-12-10 ENCOUNTER — Other Ambulatory Visit: Payer: Self-pay

## 2019-12-10 ENCOUNTER — Other Ambulatory Visit
Admission: RE | Admit: 2019-12-10 | Discharge: 2019-12-10 | Disposition: A | Discharge status: Home / Self Care | Payer: Medicare Other | Source: Ambulatory Visit | Attending: Orthopedic Surgery | Admitting: Orthopedic Surgery

## 2019-12-10 DIAGNOSIS — Z20822 Contact with and (suspected) exposure to covid-19: Secondary | ICD-10-CM | POA: Diagnosis not present

## 2019-12-10 DIAGNOSIS — Z01812 Encounter for preprocedural laboratory examination: Secondary | ICD-10-CM | POA: Insufficient documentation

## 2019-12-10 LAB — URINE CULTURE
Culture: >=100000 — AB
Special Requests: NORMAL

## 2019-12-10 LAB — SARS CORONAVIRUS 2 (TAT 6-24 HRS): SARS Coronavirus 2: NEGATIVE

## 2019-12-13 ENCOUNTER — Encounter: Payer: Self-pay | Admitting: Orthopedic Surgery

## 2019-12-13 MED ORDER — CEFAZOLIN SODIUM-DEXTROSE 2-4 GM/100ML-% IV SOLN
2.0000 g | INTRAVENOUS | Status: AC
  Administered 2019-12-14: 2 g via INTRAVENOUS

## 2019-12-13 MED ORDER — TRANEXAMIC ACID-NACL 1000-0.7 MG/100ML-% IV SOLN
1000.0000 mg | INTRAVENOUS | Status: AC
  Administered 2019-12-14: 1000 mg via INTRAVENOUS

## 2019-12-13 NOTE — H&P (Signed)
ORTHOPAEDIC HISTORY & PHYSICAL Progress Notes Debbie Chavez, [place] - 12/04/2019 9:45 AM EDT [name] Chief Complaint:   Chief Complaint  Patient presents with  . Knee Pain  H & P LEFT KNEE   History of Present Illness:   Debbie Chavez is a 69 y.o. female that presents to clinic today for her preoperative history and evaluation. Patient presents with her husband. The patient is scheduled to undergo a left total knee arthroplasty on 12/14/19 by Dr. Marry Guan. Her pain began many years ago. The pain is located primarily along the medial aspect of the knee. She describes her pain as worse with weightbearing. She reports associated swelling and some giving way of the knee. She denies associated numbness or tingling, denies locking.   The patient's symptoms have progressed to the point that they decrease her quality of life. The patient has previously undergone conservative treatment including Tyelnol, Tramadol, and injections to the knee without adequate control of her symptoms.  Denies history of blood clots, lumbar surgery, or significant cardiac history.   Past Medical, Surgical, Family, Social History, Allergies, Medications:   Past Medical History:  Past Medical History:  Diagnosis Date  . Anemia  . Arthritis  . Barrett's esophagus determined by endoscopy 09/2017  . Breast cancer (CMS-HCC)  . Gallstones  . Hyperlipidemia  . Hypertension  . Hypertension, essential, benign   Past Surgical History:  Past Surgical History:  Procedure Laterality Date  . COLONOSCOPY  . MASTECTOMY BILATERAL SIMPLE  . MASTECTOMY PARTIAL / LUMPECTOMY  . TUBAL LIGATION   Current Medications:  Current Outpatient Medications  Medication Sig Dispense Refill  . tiZANidine (ZANAFLEX) 2 MG tablet  . ACETAMINOPHEN/DIPHENHYDRAMINE (TYLENOL PM EXTRA STRENGTH ORAL) Take 2 caplet by mouth nightly as needed  . calcium carbonate-vitamin D3 (OS-CAL 500+D) 500  mg(1,250mg ) -400 unit tablet Take 1 tablet by mouth once daily.  . cholecalciferol (VITAMIN D3) 1000 unit capsule Take 1,000 Units by mouth once daily  . cyanocobalamin (VITAMIN B12) 1000 MCG tablet Take 1,000 mcg by mouth once daily  . diclofenac (VOLTAREN) 1 % topical gel Apply 2 g topically 4 (four) times daily  . docosahexaenoic acid-epa 120-180 mg Cap Take by mouth  . ferrous sulfate 325 (65 FE) MG tablet Take 325 mg by mouth every other day.  . multivitamin tablet Take 1 tablet by mouth once daily.  Marland Kitchen omega-3 fatty acids/fish oil 340-1,000 mg capsule Take 1 capsule by mouth 2 (two) times daily.  . pantoprazole (PROTONIX) 20 MG DR tablet Take 20 mg by mouth once daily  . traMADoL (ULTRAM) 50 mg tablet Take 1 tablet (50 mg total) by mouth every 6 (six) hours as needed for Pain (Patient taking differently: Take 50 mg by mouth once daily as needed for Pain ) 90 tablet 1  . valsartan (DIOVAN) 40 MG tablet Take 1 tablet (40 mg total) by mouth once daily 90 tablet 3  . vitamin E, dl,tocopheryl acet, (VITAMIN E, DL, ACETATE,) 100 unit capsule Take by mouth   No current facility-administered medications for this visit.   Allergies:  Allergies  Allergen Reactions  . Lisinopril Hives  . Meperidine Other (See Comments)  hypotension   Social History:  Social History   Socioeconomic History  . Marital status: Married  Spouse name: Debbie Chavez  . Number of children: 3  . Years of education: 81  . Highest education level: Not on file  Occupational History  . Occupation: Part-time-Merchandising  Tobacco Use  .  Smoking status: Never Smoker  . Smokeless tobacco: Never Used  Vaping Use  . Vaping Use: Never used  Substance and Sexual Activity  . Alcohol use: Not Currently  . Drug use: No  . Sexual activity: Defer  Partners: Male  Birth control/protection: Surgical  Other Topics Concern  . Not on file  Social History Narrative  . Not on file   Social Determinants of Health   Financial  Resource Strain:  . Difficulty of Paying Living Expenses:  Food Insecurity:  . Worried About Charity fundraiser in the Last Year:  . Arboriculturist in the Last Year:  Transportation Needs:  . Film/video editor (Medical):  Marland Kitchen Lack of Transportation (Non-Medical):  Physical Activity:  . Days of Exercise per Week:  . Minutes of Exercise per Session:  Stress:  . Feeling of Stress :  Social Connections:  . Frequency of Communication with Friends and Family:  . Frequency of Social Gatherings with Friends and Family:  . Attends Religious Services:  . Active Member of Clubs or Organizations:  . Attends Archivist Meetings:  Marland Kitchen Marital Status:   Family History:  Family History  Problem Relation Age of Onset  . Breast cancer Other  . High blood pressure (Hypertension) Other  . Breast cancer Mother  . Breast cancer Sister  . Breast cancer Sister   Review of Systems:   A 10+ ROS was performed, reviewed, and the pertinent orthopaedic findings are documented in the HPI.   Physical Examination:   BP 120/70  Ht 160 cm (5\' 3" )  Wt 77.8 kg (171 lb 9.6 oz)  BMI 30.40 kg/m   Patient is a well-developed, well-nourished female in no acute distress. Patient has normal mood and affect. Patient is alert and oriented to person, place, and time.   HEENT: Atraumatic, normocephalic. Pupils equal and reactive to light. Extraocular motion intact. Noninjected sclera.  Cardiovascular: Regular rate and rhythm, with no murmurs, rubs, or gallops. Distal pulses palpable.  Respiratory: Lungs clear to auscultation bilaterally.   Left Knee: Soft tissue swelling: mild Effusion: none Erythema: none Crepitance: mild Tenderness: medial Alignment: relative varus Mediolateral laxity: medial pseudolaxity Posterior sag: negative Patellar tracking: Good tracking without evidence of subluxation or tilt Atrophy: No significant atrophy.  Quadriceps tone was fair to good. Range of motion:  0/0/118 degrees  Sensation intact over the saphenous, lateral sural cutaneous, superficial fibular, and deep fibular nerve distributions.  Tests Performed/Reviewed:  X-rays  No new radiographs were obtained today. Previous radiographs were reviewed of the left knee and revealed severe loss of medial compartment joint space with subchondral sclerosis of the bone and osteophyte formation noted. Lateral compartment reveals mild loss of joint space with significant osteophyte formation. Patellofemoral joint reveals loss of joint space with osteophyte formation. No fractures or dislocations noted.  I personally interpreted and ordered today's radiographs.  Impression:   ICD-10-CM  1. Primary osteoarthritis of left knee M17.12   Plan:   The patient has end-stage degenerative changes of the left knee. It was explained to the patient that the condition is progressive in nature. Having failed conservative treatment, the patient has elected to proceed with a total joint arthroplasty. The patient will undergo a total joint arthroplasty with Dr. Marry Guan. The risks of surgery, including blood clot and infection, were discussed with the patient. Measures to reduce these risks, including the use of anticoagulation, perioperative antibiotics, and early ambulation were discussed. The importance of postoperative physical therapy was discussed with  the patient. The patient elects to proceed with surgery. The patient is instructed to stop all blood thinners prior to surgery. The patient is instructed to call the hospital the day before surgery to learn of the proper arrival time.   Contact our office with any questions or concerns. Follow up as indicated, or sooner should any new problems arise, if conditions worsen, or if they are otherwise concerned.   Debbie Fudge, PA-C [facility] [place],  [address] Phone: [phone]  This note was  generated in part with voice recognition software and I apologize for any typographical errors that were not detected and corrected.   Electronically signed by Debbie Fudge, PA at 12/04/2019 10:55 AM EDT

## 2019-12-14 ENCOUNTER — Encounter: Payer: Medicare Other | Admitting: Physical Therapy

## 2019-12-14 ENCOUNTER — Inpatient Hospital Stay: Payer: Medicare Other | Admitting: Registered Nurse

## 2019-12-14 ENCOUNTER — Inpatient Hospital Stay: Payer: Medicare Other

## 2019-12-14 ENCOUNTER — Other Ambulatory Visit: Payer: Self-pay

## 2019-12-14 ENCOUNTER — Inpatient Hospital Stay
Admission: RE | Admit: 2019-12-14 | Discharge: 2019-12-16 | DRG: 470 | Disposition: A | Discharge status: Home Health | Payer: Medicare Other | Attending: Orthopedic Surgery | Admitting: Orthopedic Surgery

## 2019-12-14 ENCOUNTER — Encounter: Payer: Self-pay | Admitting: Orthopedic Surgery

## 2019-12-14 ENCOUNTER — Encounter
Admission: RE | Disposition: A | Discharge status: Home Health | Payer: Self-pay | Source: Home / Self Care | Attending: Orthopedic Surgery

## 2019-12-14 DIAGNOSIS — Z79891 Long term (current) use of opiate analgesic: Secondary | ICD-10-CM | POA: Diagnosis not present

## 2019-12-14 DIAGNOSIS — M1712 Unilateral primary osteoarthritis, left knee: Secondary | ICD-10-CM | POA: Diagnosis present

## 2019-12-14 DIAGNOSIS — Z9013 Acquired absence of bilateral breasts and nipples: Secondary | ICD-10-CM

## 2019-12-14 DIAGNOSIS — Z803 Family history of malignant neoplasm of breast: Secondary | ICD-10-CM | POA: Diagnosis not present

## 2019-12-14 DIAGNOSIS — Z96659 Presence of unspecified artificial knee joint: Secondary | ICD-10-CM

## 2019-12-14 DIAGNOSIS — K219 Gastro-esophageal reflux disease without esophagitis: Secondary | ICD-10-CM | POA: Diagnosis present

## 2019-12-14 DIAGNOSIS — Z8249 Family history of ischemic heart disease and other diseases of the circulatory system: Secondary | ICD-10-CM | POA: Diagnosis not present

## 2019-12-14 DIAGNOSIS — E785 Hyperlipidemia, unspecified: Secondary | ICD-10-CM | POA: Diagnosis present

## 2019-12-14 DIAGNOSIS — I10 Essential (primary) hypertension: Secondary | ICD-10-CM | POA: Diagnosis present

## 2019-12-14 DIAGNOSIS — D649 Anemia, unspecified: Secondary | ICD-10-CM | POA: Diagnosis present

## 2019-12-14 DIAGNOSIS — Z853 Personal history of malignant neoplasm of breast: Secondary | ICD-10-CM

## 2019-12-14 DIAGNOSIS — Z79899 Other long term (current) drug therapy: Secondary | ICD-10-CM | POA: Diagnosis not present

## 2019-12-14 DIAGNOSIS — Z20822 Contact with and (suspected) exposure to covid-19: Secondary | ICD-10-CM | POA: Diagnosis present

## 2019-12-14 HISTORY — PX: KNEE ARTHROPLASTY: SHX992

## 2019-12-14 LAB — ABO/RH: ABO/RH(D): B NEG

## 2019-12-14 SURGERY — ARTHROPLASTY, KNEE, TOTAL, USING IMAGELESS COMPUTER-ASSISTED NAVIGATION
Anesthesia: Spinal | Site: Knee | Laterality: Left

## 2019-12-14 MED ORDER — FLEET ENEMA 7-19 GM/118ML RE ENEM: 1.0000 | ENEMA | Freq: Once | RECTAL | Status: DC | PRN

## 2019-12-14 MED ORDER — GABAPENTIN 300 MG PO CAPS
300.0000 mg | ORAL_CAPSULE | Freq: Every day | ORAL | Status: DC
  Administered 2019-12-14 – 2019-12-15 (×2): 300 mg via ORAL
  Filled 2019-12-14 (×2): qty 1
  Filled 2019-12-14: qty 3

## 2019-12-14 MED ORDER — MENTHOL 3 MG MT LOZG: 1.0000 | LOZENGE | OROMUCOSAL | Status: DC | PRN

## 2019-12-14 MED ORDER — PANTOPRAZOLE SODIUM 40 MG PO TBEC
40.0000 mg | DELAYED_RELEASE_TABLET | Freq: Two times a day (BID) | ORAL | Status: DC
  Administered 2019-12-15 – 2019-12-16 (×3): 40 mg via ORAL
  Filled 2019-12-14 (×4): qty 1

## 2019-12-14 MED ORDER — OXYCODONE HCL 5 MG PO TABS: 5.0000 mg | ORAL_TABLET | Freq: Once | ORAL | Status: DC | PRN

## 2019-12-14 MED ORDER — METOCLOPRAMIDE HCL 10 MG PO TABS: 5.0000 mg | ORAL_TABLET | Freq: Three times a day (TID) | ORAL | Status: DC | PRN

## 2019-12-14 MED ORDER — MAGNESIUM HYDROXIDE 400 MG/5ML PO SUSP
30.0000 mL | Freq: Every day | ORAL | Status: DC
  Filled 2019-12-14 (×2): qty 30

## 2019-12-14 MED ORDER — CALCIUM CARBONATE-VITAMIN D 500-200 MG-UNIT PO TABS
1.0000 | ORAL_TABLET | Freq: Every day | ORAL | Status: DC
  Administered 2019-12-15 – 2019-12-16 (×2): 1 via ORAL
  Filled 2019-12-14 (×2): qty 1

## 2019-12-14 MED ORDER — BISACODYL 10 MG RE SUPP
10.0000 mg | Freq: Every day | RECTAL | Status: DC | PRN
  Filled 2019-12-14: qty 1

## 2019-12-14 MED ORDER — FENTANYL CITRATE (PF) 100 MCG/2ML IJ SOLN: 25.0000 ug | INTRAMUSCULAR | Status: DC | PRN

## 2019-12-14 MED ORDER — PROPOFOL 10 MG/ML IV BOLUS
INTRAVENOUS | Status: DC | PRN
  Administered 2019-12-14: 40 mg via INTRAVENOUS
  Administered 2019-12-14: 30 mg via INTRAVENOUS

## 2019-12-14 MED ORDER — PROPOFOL 500 MG/50ML IV EMUL
INTRAVENOUS | Status: AC
  Filled 2019-12-14: qty 50

## 2019-12-14 MED ORDER — ACETAMINOPHEN 10 MG/ML IV SOLN: 1000.0000 mg | Freq: Once | INTRAVENOUS | Status: DC | PRN

## 2019-12-14 MED ORDER — ACETAMINOPHEN 10 MG/ML IV SOLN
INTRAVENOUS | Status: DC | PRN
  Administered 2019-12-14: 1000 mg via INTRAVENOUS

## 2019-12-14 MED ORDER — LIDOCAINE HCL (PF) 2 % IJ SOLN
INTRAMUSCULAR | Status: AC
  Filled 2019-12-14: qty 5

## 2019-12-14 MED ORDER — DIPHENHYDRAMINE HCL 12.5 MG/5ML PO ELIX: 12.5000 mg | ORAL_SOLUTION | ORAL | Status: DC | PRN

## 2019-12-14 MED ORDER — ONDANSETRON HCL 4 MG/2ML IJ SOLN: 4.0000 mg | Freq: Four times a day (QID) | INTRAMUSCULAR | Status: DC | PRN

## 2019-12-14 MED ORDER — METOCLOPRAMIDE HCL 5 MG/ML IJ SOLN: 5.0000 mg | Freq: Three times a day (TID) | INTRAMUSCULAR | Status: DC | PRN

## 2019-12-14 MED ORDER — SODIUM CHLORIDE 0.9 % IV SOLN
INTRAVENOUS | Status: DC | PRN
  Administered 2019-12-14: 60 mL

## 2019-12-14 MED ORDER — OMEGA-3-ACID ETHYL ESTERS 1 G PO CAPS
1000.0000 mg | ORAL_CAPSULE | Freq: Every day | ORAL | Status: DC
  Administered 2019-12-15 – 2019-12-16 (×2): 1000 mg via ORAL
  Filled 2019-12-14 (×2): qty 1

## 2019-12-14 MED ORDER — DEXAMETHASONE SODIUM PHOSPHATE 10 MG/ML IJ SOLN: 8.0000 mg | Freq: Once | INTRAMUSCULAR | Status: AC

## 2019-12-14 MED ORDER — OXYCODONE HCL 5 MG/5ML PO SOLN: 5.0000 mg | Freq: Once | ORAL | Status: DC | PRN

## 2019-12-14 MED ORDER — NEOMYCIN-POLYMYXIN B GU 40-200000 IR SOLN
Status: DC | PRN
  Administered 2019-12-14: 16 mL

## 2019-12-14 MED ORDER — CELECOXIB 200 MG PO CAPS: 400.0000 mg | ORAL_CAPSULE | Freq: Once | ORAL | Status: AC

## 2019-12-14 MED ORDER — PHENOL 1.4 % MT LIQD: 1.0000 | OROMUCOSAL | Status: DC | PRN

## 2019-12-14 MED ORDER — GABAPENTIN 300 MG PO CAPS
ORAL_CAPSULE | ORAL | Status: AC
  Administered 2019-12-14: 300 mg via ORAL
  Filled 2019-12-14: qty 1

## 2019-12-14 MED ORDER — ONDANSETRON HCL 4 MG/2ML IJ SOLN: 4.0000 mg | Freq: Once | INTRAMUSCULAR | Status: DC | PRN

## 2019-12-14 MED ORDER — CEFAZOLIN SODIUM-DEXTROSE 2-4 GM/100ML-% IV SOLN
INTRAVENOUS | Status: AC
  Filled 2019-12-14: qty 100

## 2019-12-14 MED ORDER — TRANEXAMIC ACID-NACL 1000-0.7 MG/100ML-% IV SOLN: 1000.0000 mg | Freq: Once | INTRAVENOUS | Status: AC

## 2019-12-14 MED ORDER — ADULT MULTIVITAMIN W/MINERALS CH
2.0000 | ORAL_TABLET | Freq: Every day | ORAL | Status: DC
  Administered 2019-12-15 – 2019-12-16 (×2): 2 via ORAL
  Filled 2019-12-14 (×2): qty 2

## 2019-12-14 MED ORDER — CEFAZOLIN SODIUM-DEXTROSE 2-4 GM/100ML-% IV SOLN
2.0000 g | Freq: Four times a day (QID) | INTRAVENOUS | Status: AC
  Administered 2019-12-14 – 2019-12-15 (×4): 2 g via INTRAVENOUS
  Filled 2019-12-14 (×4): qty 100

## 2019-12-14 MED ORDER — PROPOFOL 10 MG/ML IV BOLUS
INTRAVENOUS | Status: AC
  Filled 2019-12-14: qty 20

## 2019-12-14 MED ORDER — GABAPENTIN 300 MG PO CAPS: 300.0000 mg | ORAL_CAPSULE | Freq: Once | ORAL | Status: AC

## 2019-12-14 MED ORDER — ALUM & MAG HYDROXIDE-SIMETH 200-200-20 MG/5ML PO SUSP: 30.0000 mL | ORAL | Status: DC | PRN

## 2019-12-14 MED ORDER — OXYCODONE HCL 5 MG PO TABS
10.0000 mg | ORAL_TABLET | ORAL | Status: DC | PRN
  Administered 2019-12-15: 10 mg via ORAL
  Filled 2019-12-14: qty 2

## 2019-12-14 MED ORDER — BUPIVACAINE HCL (PF) 0.5 % IJ SOLN
INTRAMUSCULAR | Status: AC
  Filled 2019-12-14: qty 10

## 2019-12-14 MED ORDER — IRBESARTAN 75 MG PO TABS
37.5000 mg | ORAL_TABLET | Freq: Every day | ORAL | Status: DC
  Administered 2019-12-15 – 2019-12-16 (×2): 37.5 mg via ORAL
  Filled 2019-12-14 (×2): qty 0.5

## 2019-12-14 MED ORDER — SENNOSIDES-DOCUSATE SODIUM 8.6-50 MG PO TABS
1.0000 | ORAL_TABLET | Freq: Two times a day (BID) | ORAL | Status: DC
  Administered 2019-12-14 – 2019-12-16 (×4): 1 via ORAL
  Filled 2019-12-14 (×4): qty 1

## 2019-12-14 MED ORDER — CELECOXIB 200 MG PO CAPS
ORAL_CAPSULE | ORAL | Status: AC
  Administered 2019-12-14: 400 mg via ORAL
  Filled 2019-12-14: qty 2

## 2019-12-14 MED ORDER — METOCLOPRAMIDE HCL 10 MG PO TABS
10.0000 mg | ORAL_TABLET | Freq: Three times a day (TID) | ORAL | Status: DC
  Administered 2019-12-14 – 2019-12-16 (×7): 10 mg via ORAL
  Filled 2019-12-14 (×7): qty 1

## 2019-12-14 MED ORDER — FERROUS SULFATE 325 (65 FE) MG PO TABS
325.0000 mg | ORAL_TABLET | Freq: Two times a day (BID) | ORAL | Status: DC
  Administered 2019-12-15 – 2019-12-16 (×3): 325 mg via ORAL
  Filled 2019-12-14 (×3): qty 1

## 2019-12-14 MED ORDER — TRANEXAMIC ACID-NACL 1000-0.7 MG/100ML-% IV SOLN
INTRAVENOUS | Status: AC
  Filled 2019-12-14: qty 100

## 2019-12-14 MED ORDER — MIDAZOLAM HCL 2 MG/2ML IJ SOLN
INTRAMUSCULAR | Status: DC | PRN
Start: 2019-12-14 — End: 2019-12-14
  Administered 2019-12-14: 1 mg via INTRAVENOUS

## 2019-12-14 MED ORDER — PHENYLEPHRINE HCL (PRESSORS) 10 MG/ML IV SOLN
INTRAVENOUS | Status: DC | PRN
  Administered 2019-12-14 (×2): 100 ug via INTRAVENOUS

## 2019-12-14 MED ORDER — GLYCOPYRROLATE 0.2 MG/ML IJ SOLN
INTRAMUSCULAR | Status: DC | PRN
  Administered 2019-12-14: .2 mg via INTRAVENOUS

## 2019-12-14 MED ORDER — ONDANSETRON HCL 4 MG/2ML IJ SOLN
INTRAMUSCULAR | Status: DC | PRN
  Administered 2019-12-14: 4 mg via INTRAVENOUS

## 2019-12-14 MED ORDER — DEXAMETHASONE SODIUM PHOSPHATE 10 MG/ML IJ SOLN
INTRAMUSCULAR | Status: AC
  Administered 2019-12-14: 8 mg via INTRAVENOUS
  Filled 2019-12-14: qty 1

## 2019-12-14 MED ORDER — BUPIVACAINE HCL (PF) 0.5 % IJ SOLN
INTRAMUSCULAR | Status: DC | PRN
  Administered 2019-12-14: 2.8 mL

## 2019-12-14 MED ORDER — SODIUM CHLORIDE 0.9 % IV SOLN
INTRAVENOUS | Status: DC | PRN
  Administered 2019-12-14: 50 ug/min via INTRAVENOUS

## 2019-12-14 MED ORDER — LABETALOL HCL 5 MG/ML IV SOLN
INTRAVENOUS | Status: AC
  Filled 2019-12-14: qty 4

## 2019-12-14 MED ORDER — CELECOXIB 200 MG PO CAPS
200.0000 mg | ORAL_CAPSULE | Freq: Two times a day (BID) | ORAL | Status: DC
  Administered 2019-12-14 – 2019-12-16 (×4): 200 mg via ORAL
  Filled 2019-12-14 (×4): qty 1

## 2019-12-14 MED ORDER — TRAMADOL HCL 50 MG PO TABS: 50.0000 mg | ORAL_TABLET | ORAL | Status: DC | PRN

## 2019-12-14 MED ORDER — ACETAMINOPHEN 325 MG PO TABS: 325.0000 mg | ORAL_TABLET | Freq: Four times a day (QID) | ORAL | Status: DC | PRN

## 2019-12-14 MED ORDER — PROPOFOL 500 MG/50ML IV EMUL
INTRAVENOUS | Status: DC | PRN
  Administered 2019-12-14: 100 ug/kg/min via INTRAVENOUS

## 2019-12-14 MED ORDER — VITAMIN B-12 1000 MCG PO TABS
1000.0000 ug | ORAL_TABLET | Freq: Every day | ORAL | Status: DC
  Administered 2019-12-15 – 2019-12-16 (×2): 1000 ug via ORAL
  Filled 2019-12-14 (×2): qty 1

## 2019-12-14 MED ORDER — ORAL CARE MOUTH RINSE: 15.0000 mL | Freq: Once | OROMUCOSAL | Status: AC

## 2019-12-14 MED ORDER — CHLORHEXIDINE GLUCONATE 0.12 % MT SOLN: 15.0000 mL | Freq: Once | OROMUCOSAL | Status: AC

## 2019-12-14 MED ORDER — SODIUM CHLORIDE 0.9 % IV SOLN: INTRAVENOUS | Status: DC

## 2019-12-14 MED ORDER — BUPIVACAINE HCL (PF) 0.25 % IJ SOLN
INTRAMUSCULAR | Status: DC | PRN
  Administered 2019-12-14: 60 mL

## 2019-12-14 MED ORDER — ONDANSETRON HCL 4 MG PO TABS: 4.0000 mg | ORAL_TABLET | Freq: Four times a day (QID) | ORAL | Status: DC | PRN

## 2019-12-14 MED ORDER — MIDAZOLAM HCL 2 MG/2ML IJ SOLN
INTRAMUSCULAR | Status: AC
  Filled 2019-12-14: qty 2

## 2019-12-14 MED ORDER — CHLORHEXIDINE GLUCONATE 0.12 % MT SOLN
OROMUCOSAL | Status: AC
  Administered 2019-12-14: 15 mL via OROMUCOSAL
  Filled 2019-12-14: qty 15

## 2019-12-14 MED ORDER — ENSURE PRE-SURGERY PO LIQD
296.0000 mL | Freq: Once | ORAL | Status: DC
  Filled 2019-12-14: qty 296

## 2019-12-14 MED ORDER — HYDROMORPHONE HCL 1 MG/ML IJ SOLN: 0.5000 mg | INTRAMUSCULAR | Status: DC | PRN

## 2019-12-14 MED ORDER — OXYCODONE HCL 5 MG PO TABS
5.0000 mg | ORAL_TABLET | ORAL | Status: DC | PRN
  Administered 2019-12-15 – 2019-12-16 (×2): 5 mg via ORAL
  Filled 2019-12-14 (×2): qty 1

## 2019-12-14 MED ORDER — VITAMIN D 25 MCG (1000 UNIT) PO TABS
1000.0000 [IU] | ORAL_TABLET | Freq: Every day | ORAL | Status: DC
  Administered 2019-12-15 – 2019-12-16 (×2): 1000 [IU] via ORAL
  Filled 2019-12-14 (×2): qty 1

## 2019-12-14 MED ORDER — LACTATED RINGERS IV SOLN: INTRAVENOUS | Status: DC

## 2019-12-14 MED ORDER — ACETAMINOPHEN 10 MG/ML IV SOLN
1000.0000 mg | Freq: Four times a day (QID) | INTRAVENOUS | Status: AC
  Administered 2019-12-15 (×4): 1000 mg via INTRAVENOUS
  Filled 2019-12-14 (×4): qty 100

## 2019-12-14 MED ORDER — ENOXAPARIN SODIUM 30 MG/0.3ML ~~LOC~~ SOLN
30.0000 mg | Freq: Two times a day (BID) | SUBCUTANEOUS | Status: DC
  Administered 2019-12-15 – 2019-12-16 (×4): 30 mg via SUBCUTANEOUS
  Filled 2019-12-14 (×4): qty 0.3

## 2019-12-14 MED ORDER — CHLORHEXIDINE GLUCONATE 4 % EX LIQD: 60.0000 mL | Freq: Once | CUTANEOUS | Status: DC

## 2019-12-14 MED ORDER — TRANEXAMIC ACID-NACL 1000-0.7 MG/100ML-% IV SOLN
INTRAVENOUS | Status: AC
  Administered 2019-12-14: 1000 mg via INTRAVENOUS
  Filled 2019-12-14: qty 100

## 2019-12-14 SURGICAL SUPPLY — 72 items
ATTUNE PS FEM LT SZ 6 CEM KNEE (Femur) ×2 IMPLANT
ATTUNE PSRP INSR SZ6 8 KNEE (Insert) ×1 IMPLANT
ATTUNE PSRP INSR SZ6 8MM KNEE (Insert) ×1 IMPLANT
BASE TIBIA ATTUNE KNEE SYS SZ6 (Knees) IMPLANT
BATTERY INSTRU NAVIGATION (MISCELLANEOUS) ×12 IMPLANT
BLADE SAW 70X12.5 (BLADE) ×3 IMPLANT
BLADE SAW 90X13X1.19 OSCILLAT (BLADE) ×3 IMPLANT
BLADE SAW 90X25X1.19 OSCILLAT (BLADE) ×3 IMPLANT
CANISTER SUCT 3000ML PPV (MISCELLANEOUS) ×3 IMPLANT
CEMENT HV SMART SET (Cement) ×4 IMPLANT
COOLER POLAR GLACIER W/PUMP (MISCELLANEOUS) ×3 IMPLANT
COVER WAND RF STERILE (DRAPES) ×3 IMPLANT
CUFF TOURN SGL QUICK 30 (TOURNIQUET CUFF) ×2
CUFF TRNQT CYL 30X4X21-28X (TOURNIQUET CUFF) IMPLANT
DRAPE 3/4 80X56 (DRAPES) ×3 IMPLANT
DRSG DERMACEA 8X12 NADH (GAUZE/BANDAGES/DRESSINGS) ×3 IMPLANT
DRSG OPSITE POSTOP 4X14 (GAUZE/BANDAGES/DRESSINGS) ×3 IMPLANT
DRSG TEGADERM 4X4.75 (GAUZE/BANDAGES/DRESSINGS) ×3 IMPLANT
DURAPREP 26ML APPLICATOR (WOUND CARE) ×6 IMPLANT
ELECT REM PT RETURN 9FT ADLT (ELECTROSURGICAL) ×3
ELECTRODE REM PT RTRN 9FT ADLT (ELECTROSURGICAL) ×1 IMPLANT
EX-PIN ORTHOLOCK NAV 4X150 (PIN) ×6 IMPLANT
GLOVE BIO SURGEON STRL SZ7.5 (GLOVE) ×6 IMPLANT
GLOVE BIOGEL M STRL SZ7.5 (GLOVE) ×6 IMPLANT
GLOVE BIOGEL PI IND STRL 7.5 (GLOVE) ×1 IMPLANT
GLOVE BIOGEL PI INDICATOR 7.5 (GLOVE) ×2
GLOVE INDICATOR 8.0 STRL GRN (GLOVE) ×3 IMPLANT
GOWN STRL REUS W/ TWL LRG LVL3 (GOWN DISPOSABLE) ×2 IMPLANT
GOWN STRL REUS W/ TWL XL LVL3 (GOWN DISPOSABLE) ×1 IMPLANT
GOWN STRL REUS W/TWL LRG LVL3 (GOWN DISPOSABLE) ×4
GOWN STRL REUS W/TWL XL LVL3 (GOWN DISPOSABLE) ×2
HEMOVAC 400CC 10FR (MISCELLANEOUS) ×3 IMPLANT
HOLDER FOLEY CATH W/STRAP (MISCELLANEOUS) ×3 IMPLANT
HOOD PEEL AWAY FLYTE STAYCOOL (MISCELLANEOUS) ×6 IMPLANT
KIT TURNOVER KIT A (KITS) ×3 IMPLANT
KNIFE SCULPS 14X20 (INSTRUMENTS) ×3 IMPLANT
LABEL OR SOLS (LABEL) ×3 IMPLANT
MANIFOLD NEPTUNE II (INSTRUMENTS) ×3 IMPLANT
NDL SAFETY ECLIPSE 18X1.5 (NEEDLE) ×1 IMPLANT
NDL SPNL 20GX3.5 QUINCKE YW (NEEDLE) ×2 IMPLANT
NEEDLE HYPO 18GX1.5 SHARP (NEEDLE) ×2
NEEDLE SPNL 20GX3.5 QUINCKE YW (NEEDLE) ×6 IMPLANT
NS IRRIG 500ML POUR BTL (IV SOLUTION) ×3 IMPLANT
PACK TOTAL KNEE (MISCELLANEOUS) ×3 IMPLANT
PAD WRAPON POLAR KNEE (MISCELLANEOUS) ×1 IMPLANT
PATELLA MEDIAL ATTUN 35MM KNEE (Knees) ×2 IMPLANT
PENCIL SMOKE EVACUATOR COATED (MISCELLANEOUS) ×3 IMPLANT
PENCIL SMOKE ULTRAEVAC 22 CON (MISCELLANEOUS) ×3 IMPLANT
PIN DRILL QUICK PACK ×3 IMPLANT
PIN FIXATION 1/8DIA X 3INL (PIN) ×9 IMPLANT
PULSAVAC PLUS IRRIG FAN TIP (DISPOSABLE) ×3
SOL .9 NS 3000ML IRR  AL (IV SOLUTION) ×2
SOL .9 NS 3000ML IRR UROMATIC (IV SOLUTION) ×1 IMPLANT
SOL PREP PVP 2OZ (MISCELLANEOUS) ×3
SOLUTION PREP PVP 2OZ (MISCELLANEOUS) ×1 IMPLANT
SPONGE DRAIN TRACH 4X4 STRL 2S (GAUZE/BANDAGES/DRESSINGS) ×3 IMPLANT
STAPLER SKIN PROX 35W (STAPLE) ×3 IMPLANT
STOCKINETTE IMPERV 14X48 (MISCELLANEOUS) IMPLANT
STRAP TIBIA SHORT (MISCELLANEOUS) ×3 IMPLANT
SUCTION FRAZIER HANDLE 10FR (MISCELLANEOUS) ×2
SUCTION TUBE FRAZIER 10FR DISP (MISCELLANEOUS) ×1 IMPLANT
SUT VIC AB 0 CT1 36 (SUTURE) ×6 IMPLANT
SUT VIC AB 1 CT1 36 (SUTURE) ×6 IMPLANT
SUT VIC AB 2-0 CT2 27 (SUTURE) ×3 IMPLANT
SYR 20ML LL LF (SYRINGE) ×3 IMPLANT
SYR 30ML LL (SYRINGE) ×6 IMPLANT
TIBIA ATTUNE KNEE SYS BASE SZ6 (Knees) ×3 IMPLANT
TIP FAN IRRIG PULSAVAC PLUS (DISPOSABLE) ×1 IMPLANT
TOWEL OR 17X26 4PK STRL BLUE (TOWEL DISPOSABLE) ×3 IMPLANT
TOWER CARTRIDGE SMART MIX (DISPOSABLE) ×3 IMPLANT
TRAY FOLEY MTR SLVR 16FR STAT (SET/KITS/TRAYS/PACK) ×3 IMPLANT
WRAPON POLAR PAD KNEE (MISCELLANEOUS) ×3

## 2019-12-14 NOTE — Transfer of Care (Signed)
Immediate Anesthesia Transfer of Care Note  Patient: Debbie Chavez  Procedure(s) Performed: COMPUTER ASSISTED TOTAL KNEE ARTHROPLASTY (Left Knee)  Patient Location: PACU  Anesthesia Type:Spinal  Level of Consciousness: awake, oriented, drowsy and patient cooperative  Airway & Oxygen Therapy: Patient Spontanous Breathing  Post-op Assessment: Report given to RN and Post -op Vital signs reviewed and stable  Post vital signs: Reviewed and stable  Last Vitals:  Vitals Value Taken Time  BP 96/62 12/14/19 1951  Temp    Pulse 86 12/14/19 1952  Resp 17 12/14/19 1952  SpO2 93 % 12/14/19 1952  Vitals shown include unvalidated device data.  Last Pain:  Vitals:   12/14/19 1425  TempSrc:   PainSc: 0-No pain         Complications: No complications documented.

## 2019-12-14 NOTE — H&P (Signed)
The patient has been re-examined, and the chart reviewed, and there have been no interval changes to the documented history and physical.    The risks, benefits, and alternatives have been discussed at length. The patient expressed understanding of the risks benefits and agreed with plans for surgical intervention.  Kade Demicco P. Edan Juday, Jr. M.D.    

## 2019-12-14 NOTE — Anesthesia Procedure Notes (Signed)
Spinal  Patient location during procedure: OR Start time: 12/14/2019 4:42 PM End time: 12/14/2019 4:47 PM Staffing Performed: resident/CRNA  Anesthesiologist: Piscitello, Precious Haws, MD Resident/CRNA: Lia Foyer, CRNA Preanesthetic Checklist Completed: patient identified, IV checked, site marked, risks and benefits discussed, surgical consent, monitors and equipment checked, pre-op evaluation and timeout performed Spinal Block Patient position: sitting Prep: DuraPrep Patient monitoring: heart rate, cardiac monitor, continuous pulse ox and blood pressure Approach: midline Location: L3-4 Injection technique: single-shot Needle Needle type: Pencan  Needle gauge: 24 G Needle length: 9 cm Assessment Sensory level: T4

## 2019-12-14 NOTE — Anesthesia Preprocedure Evaluation (Signed)
Anesthesia Evaluation  Patient identified by MRN, date of birth, ID band Patient awake    Reviewed: Allergy & Precautions, H&P , NPO status , Patient's Chart, lab work & pertinent test results  History of Anesthesia Complications Negative for: history of anesthetic complications  Airway Mallampati: I  TM Distance: >3 FB Neck ROM: full    Dental no notable dental hx.    Pulmonary neg pulmonary ROS,    Pulmonary exam normal breath sounds clear to auscultation       Cardiovascular hypertension, On Medications Normal cardiovascular exam Rhythm:regular Rate:Normal     Neuro/Psych negative neurological ROS     GI/Hepatic Neg liver ROS, hiatal hernia, GERD  Medicated and Controlled,  Endo/Other  negative endocrine ROS  Renal/GU negative Renal ROS  negative genitourinary   Musculoskeletal   Abdominal   Peds  Hematology  (+) Blood dyscrasia, anemia ,   Anesthesia Other Findings   Reproductive/Obstetrics                             Anesthesia Physical  Anesthesia Plan  ASA: II  Anesthesia Plan: General/Spinal   Post-op Pain Management:    Induction: Intravenous  PONV Risk Score and Plan: 4 or greater and Ondansetron, Dexamethasone, Propofol infusion, TIVA and Midazolam  Airway Management Planned: Natural Airway  Additional Equipment: None  Intra-op Plan:   Post-operative Plan:   Informed Consent: I have reviewed the patients History and Physical, chart, labs and discussed the procedure including the risks, benefits and alternatives for the proposed anesthesia with the patient or authorized representative who has indicated his/her understanding and acceptance.       Plan Discussed with: CRNA and Surgeon  Anesthesia Plan Comments: (Discussed R/B/A of neuraxial anesthesia technique with patient: - rare risks of spinal/epidural hematoma, nerve damage, infection - Risk of PDPH -  Risk of nausea and vomiting - Risk of conversion to general anesthesia and its associated risks, including sore throat, damage to lips/teeth/oropharynx, and rare risks such as cardiac and respiratory events.  Patient voiced understanding.)        Anesthesia Quick Evaluation

## 2019-12-14 NOTE — Progress Notes (Signed)
Pt felt faint and weak after she got lab work. BP low (55SBP) 2 RN in room and patient helped into stretcher. Bp rechecked 82SBP, after a few minutes BP increased to 110/70. IV started and IVF began.

## 2019-12-14 NOTE — Op Note (Signed)
OPERATIVE NOTE  DATE OF SURGERY:  12/14/2019  PATIENT NAME:  Debbie Chavez   DOB: [birth year]  MRN: [id number]  PRE-OPERATIVE DIAGNOSIS: Degenerative arthrosis of the left knee, primary  POST-OPERATIVE DIAGNOSIS:  Same  PROCEDURE:  Left total knee arthroplasty using computer-assisted navigation  SURGEON:  Marciano Sequin. M.D.  ASSISTANT:  Chavez Smiles, PA-C (present and scrubbed throughout the case, critical for assistance with exposure, retraction, instrumentation, and closure)  ANESTHESIA: spinal  ESTIMATED BLOOD LOSS: 50 mL  FLUIDS REPLACED: 900 mL of crystalloid  TOURNIQUET TIME: 79 minutes  DRAINS: 2 medium Hemovac drains  SOFT TISSUE RELEASES: Anterior cruciate ligament, posterior cruciate ligament, deep medial collateral ligament, patellofemoral ligament  IMPLANTS UTILIZED: DePuy Attune size 6 posterior stabilized femoral component (cemented), size 6 rotating platform tibial component (cemented), 35 mm medialized dome patella (cemented), and a 8 mm stabilized rotating platform polyethylene insert.  INDICATIONS FOR SURGERY: Debbie Chavez is a 69 y.o. year old female with a long history of progressive knee pain. X-rays demonstrated severe degenerative changes in tricompartmental fashion. The patient had not seen any significant improvement despite conservative nonsurgical intervention. After discussion of the risks and benefits of surgical intervention, the patient expressed understanding of the risks benefits and agree with plans for total knee arthroplasty.   The risks, benefits, and alternatives were discussed at length including but not limited to the risks of infection, bleeding, nerve injury, stiffness, blood clots, the need for revision surgery, cardiopulmonary complications, among others, and they were willing to proceed.  PROCEDURE IN DETAIL: The patient was brought into the operating room and, after adequate spinal anesthesia was achieved, a tourniquet was placed on the  patient's upper thigh. The patient's knee and leg were cleaned and prepped with alcohol and DuraPrep and draped in the usual sterile fashion. A "timeout" was performed as per usual protocol. The lower extremity was exsanguinated using an Esmarch, and the tourniquet was inflated to 300 mmHg. An anterior longitudinal incision was made followed by a standard mid vastus approach. The deep fibers of the medial collateral ligament were elevated in a subperiosteal fashion off of the medial flare of the tibia so as to maintain a continuous soft tissue sleeve. The patella was subluxed laterally and the patellofemoral ligament was incised. Inspection of the knee demonstrated severe degenerative changes with full-thickness loss of articular cartilage. Osteophytes were debrided using a rongeur. Anterior and posterior cruciate ligaments were excised. Two 4.0 mm Schanz pins were inserted in the femur and into the tibia for attachment of the array of trackers used for computer-assisted navigation. Hip center was identified using a circumduction technique. Distal landmarks were mapped using the computer. The distal femur and proximal tibia were mapped using the computer. The distal femoral cutting guide was positioned using computer-assisted navigation so as to achieve a 5 distal valgus cut. The femur was sized and it was felt that a size 6 femoral component was appropriate. A size 6 femoral cutting guide was positioned and the anterior cut was performed and verified using the computer. This was followed by completion of the posterior and chamfer cuts. Femoral cutting guide for the central box was then positioned in the center box cut was performed.  Attention was then directed to the proximal tibia. Medial and lateral menisci were excised. The extramedullary tibial cutting guide was positioned using computer-assisted navigation so as to achieve a 0 varus-valgus alignment and 3 posterior slope. The cut was performed and  verified using the computer. The proximal tibia  was sized and it was felt that a size 6 tibial tray was appropriate. Tibial and femoral trials were inserted followed by insertion of a 8 mm polyethylene insert.This allowed for excellent mediolateral soft tissue balancing both in flexion and in full extension. Finally, the patella was cut and prepared so as to accommodate a 35 mm medialized dome patella. A patella trial was placed and the knee was placed through a range of motion with excellent patellar tracking appreciated. The femoral trial was removed after debridement of posterior osteophytes. The central post-hole for the tibial component was reamed followed by insertion of a keel punch. Tibial trials were then removed. Cut surfaces of bone were irrigated with copious amounts of normal saline with antibiotic solution using pulsatile lavage and then suctioned dry. Polymethylmethacrylate cement was prepared in the usual fashion using a vacuum mixer. Cement was applied to the cut surface of the proximal tibia as well as along the undersurface of a size 6 rotating platform tibial component. Tibial component was positioned and impacted into place. Excess cement was removed using [employer] fast streamer. Cement was then applied to the cut surfaces of the femur as well as along the posterior flanges of the size 6 femoral component. The femoral component was positioned and impacted into place. Excess cement was removed using [employer] fast streamer. An 8 mm polyethylene trial was inserted and the knee was brought into full extension with steady axial compression applied. Finally, cement was applied to the backside of a 35 mm medialized dome patella and the patellar component was positioned and patellar clamp applied. Excess cement was removed using [employer] fast streamer. After adequate curing of the cement, the tourniquet was deflated after a total tourniquet time of 79 minutes. Hemostasis was achieved using electrocautery. The knee was  irrigated with copious amounts of normal saline with antibiotic solution using pulsatile lavage and then suctioned dry. 20 mL of 1.3% Exparel and 60 mL of 0.25% Marcaine in 40 mL of normal saline was injected along the posterior capsule, medial and lateral gutters, and along the arthrotomy site. An 8 mm stabilized rotating platform polyethylene insert was inserted and the knee was placed through a range of motion with excellent mediolateral soft tissue balancing appreciated and excellent patellar tracking noted. 2 medium drains were placed in the wound bed and brought out through separate stab incisions. The medial parapatellar portion of the incision was reapproximated using interrupted sutures of #1 Vicryl. Subcutaneous tissue was approximated in layers using first #0 Vicryl followed #2-0 Vicryl. The skin was approximated with skin staples. A sterile dressing was applied.  The patient tolerated the procedure well and was transported to the recovery room in stable condition.    Gaye Scorza P. Holley Bouche., M.D.

## 2019-12-15 ENCOUNTER — Encounter: Payer: Self-pay | Admitting: Orthopedic Surgery

## 2019-12-15 NOTE — Evaluation (Signed)
Physical Therapy Evaluation Patient Details Name: Debbie Chavez MRN: [id number] DOB: Dec 04, 1950 Today's Date: 12/15/2019   History of Present Illness  Pt is a 69 y/o female POD1 s/p L TKA. PMHx includes HTN, HLD, hx breast cancer, GERD, anemia, arthritis, and Barrett's esophagus.  Clinical Impression  Debbie Chavez is a very pleasant 69 y/e POD 1 s/p L TKA. She was in recliner chair upon arrival to room with family present. Pt agreeable to PT evaluation. Pt performed therex while seated in chair for promotion of LLE strengthening and improved ROM for carryover to safe transfers, ambulation, and stair negotiation. Pt ambulated 300 feet using RW with CGA for safety with steady gait speed, good fluidity of gait, and no LOB. At this time, pt exhibits decreased ROM and strength therefore recommend skilled therapy while admitted this acute stay and HHPT at discharge to maximize functional mobility and independence. As soon as RW gets here, pt will have all equipment needed for discharge.     Follow Up Recommendations Home health PT    Equipment Recommendations  None recommended by PT    Recommendations for Other Services       Precautions / Restrictions Precautions Precautions: Fall Restrictions Weight Bearing Restrictions: Yes LLE Weight Bearing: Weight bearing as tolerated      Mobility  Bed Mobility Overal bed mobility: Modified Independent             General bed mobility comments: not assessed as pt up in chair upon arrival  Transfers Overall transfer level: Needs assistance Equipment used: Rolling walker (2 wheeled) Transfers: Sit to/from Stand Sit to Stand: Min guard         General transfer comment: pt with good hand/foot placement during transfers  Ambulation/Gait Ambulation/Gait assistance: Min guard Gait Distance (Feet): 300 Feet Assistive device: Rolling walker (2 wheeled) Gait Pattern/deviations: Step-through pattern Gait velocity: WNL   General Gait Details:  Pt with good heel toe progression with ambulation and able to maintain a fluid and steady gait pattern and speed throughout distance. Noted no LOB.  Stairs            Wheelchair Mobility    Modified Rankin (Stroke Patients Only)       Balance Overall balance assessment: Mild deficits observed, not formally tested                                           Pertinent Vitals/Pain Pain Assessment: 0-10 Pain Score: 4  Pain Location: L knee Pain Descriptors / Indicators: Aching;Discomfort Pain Intervention(s): Monitored during session;Ice applied;Repositioned    Home Living Family/patient expects to be discharged to:: Private residence Living Arrangements: Spouse/significant other Available Help at Discharge: Family;Available 24 hours/day Type of Home: House Home Access: Stairs to enter Entrance Stairs-Rails: Left (bilateral rails but cannot reach both) Entrance Stairs-Number of Steps: 3 (8 steps at back entrance with a rail) Home Layout: One level Home Equipment: Bedside commode;Adaptive equipment;Shower seat - built in Additional Comments: has BSC she can borrow from daughter    Prior Function Level of Independence: Independent         Comments: Pt indep in all aspects, working part time with [employer], no falls in past 12 months.     Hand Dominance   Dominant Hand: Right    Extremity/Trunk Assessment   Upper Extremity Assessment Upper Extremity Assessment: Overall WFL for tasks assessed  Lower Extremity Assessment Lower Extremity Assessment: LLE deficits/detail LLE Deficits / Details: POD 1 L TKA LLE: Unable to fully assess due to immobilization (L knee ace wrapped over polar pack)    Cervical / Trunk Assessment Cervical / Trunk Assessment: Normal  Communication   Communication: No difficulties  Cognition Arousal/Alertness: Awake/alert Behavior During Therapy: WFL for tasks assessed/performed Overall Cognitive Status: Within  Functional Limits for tasks assessed                                        General Comments      Exercises Total Joint Exercises Ankle Circles/Pumps: AROM;Both;10 reps;Seated Quad Sets: AROM;Left;10 reps;Seated Long Arc Quad: AROM;10 reps;Seated Goniometric ROM: L knee: 0-98 degrees Other Exercises Other Exercises: Pt educated on importance of keeping L heel floated for promotion of full knee extension.   Assessment/Plan    PT Assessment Patient needs continued PT services  PT Problem List Decreased strength;Decreased range of motion;Decreased activity tolerance;Decreased balance;Decreased mobility;Pain       PT Treatment Interventions DME instruction;Gait training;Stair training;Functional mobility training;Therapeutic activities;Therapeutic exercise;Balance training;Patient/family education    PT Goals (Current goals can be found in the Care Plan section)  Acute Rehab PT Goals Patient Stated Goal: go home tomorrow and recover PT Goal Formulation: With patient Time For Goal Achievement: 12/29/19 Potential to Achieve Goals: Good    Frequency BID   Barriers to discharge        Co-evaluation               AM-PAC PT "6 Clicks" Mobility  Outcome Measure Help needed turning from your back to your side while in a flat bed without using bedrails?: A Little Help needed moving from lying on your back to sitting on the side of a flat bed without using bedrails?: A Little Help needed moving to and from a bed to a chair (including a wheelchair)?: A Little Help needed standing up from a chair using your arms (e.g., wheelchair or bedside chair)?: A Little Help needed to walk in hospital room?: A Little Help needed climbing 3-5 steps with a railing? : A Little 6 Click Score: 18    End of Session Equipment Utilized During Treatment: Gait belt Activity Tolerance: Patient tolerated treatment well Patient left: in chair;with call bell/phone within reach;with  chair alarm set;with family/visitor present;with SCD's reapplied Nurse Communication: Mobility status PT Visit Diagnosis: Unsteadiness on feet (R26.81);Muscle weakness (generalized) (M62.81);Other (comment)    Time: 6815-9470 PT Time Calculation (min) (ACUTE ONLY): 25 min   Charges:              Vale Haven, SPT  Vale Haven 12/15/2019, 11:03 AM

## 2019-12-15 NOTE — Discharge Summary (Signed)
Physician Discharge Summary  Patient ID: Debbie Chavez MRN: [id number] DOB/AGE: August 31, 1950 69 y.o.  Admit date: 12/14/2019 Discharge date: 12/16/2019  Admission Diagnoses:  Total knee replacement status [Z96.659]  Surgeries:Procedure(s): Left total knee arthroplasty using computer-assisted navigation  SURGEON:  Marciano Sequin. M.D.  ASSISTANT:  Cassell Smiles, PA-C (present and scrubbed throughout the case, critical for assistance with exposure, retraction, instrumentation, and closure)  ANESTHESIA: spinal  ESTIMATED BLOOD LOSS: 50 mL  FLUIDS REPLACED: 900 mL of crystalloid  TOURNIQUET TIME: 79 minutes  DRAINS: 2 medium Hemovac drains  SOFT TISSUE RELEASES: Anterior cruciate ligament, posterior cruciate ligament, deep medial collateral ligament, patellofemoral ligament  IMPLANTS UTILIZED: DePuy Attune size 6 posterior stabilized femoral component (cemented), size 6 rotating platform tibial component (cemented), 35 mm medialized dome patella (cemented), and a 8 mm stabilized rotating platform polyethylene insert.  Discharge Diagnoses: Patient Active Problem List   Diagnosis Date Noted  . 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 Postmenopausal osteoporosis 06/10/2018  . Special screening for malignant neoplasms, colon   . Benign neoplasm of ascending colon   . Intestinal lump   . Diverticulosis of large intestine without diverticulitis   . Abnormal CT scan, stomach   . Barrett's esophagus without dysplasia   . Stomach irritation   . Hiatal hernia   . Breast cancer ([place]) 07/19/2017  . Hypertension, essential, benign 07/19/2017  . Primary osteoarthritis of right knee 07/18/2017  . Anemia, unspecified 12/04/2013  . Arthritis involving multiple sites 12/04/2013  . Hyperlipidemia, unspecified 12/04/2013    Past Medical History:  Diagnosis Date  . Anemia   . Arthritis    hands, knees  . Cancer ([place]) 06/2007   right breast   . GERD (gastroesophageal reflux  disease)   . History of hiatal hernia   . Hypertension      Transfusion: n/a   Consultants (if any):   Discharged Condition: Improved  Hospital Course: Debbie Chavez is an 69 y.o. female who was admitted 12/14/2019 with a diagnosis of left knee osteoarthritis and went to the operating room on 12/14/2019 and underwent left total knee arthroplasty. The patient received perioperative antibiotics for prophylaxis (see below). The patient tolerated the procedure well and was transported to PACU in stable condition. After meeting PACU criteria, the patient was subsequently transferred to the Orthopaedics/Rehabilitation unit.   The patient received DVT prophylaxis in the form of early mobilization, Lovenox, Foot Pumps and TED hose. A sacral pad had been placed and heels were elevated off of the bed with rolled towels in order to protect skin integrity. Foley catheter was discontinued on postoperative day #0. Wound drains were discontinued on postoperative day #2. The surgical incision was healing well without signs of infection.  Physical therapy was initiated postoperatively for transfers, gait training, and strengthening. Occupational therapy was initiated for activities of daily living and evaluation for assisted devices. Rehabilitation goals were reviewed in detail with the patient. The patient made steady progress with physical therapy and physical therapy recommended discharge to Home.   The patient achieved the preliminary goals of this hospitalization and was felt to be medically and orthopaedically appropriate for discharge.  She was given perioperative antibiotics:  Anti-infectives (From admission, onward)   Start     Dose/Rate Route Frequency Ordered Stop   12/14/19 2300  ceFAZolin (ANCEF) IVPB 2g/100 mL premix        2 g 200 mL/hr over 30 Minutes Intravenous Every 6 hours 12/14/19 2109 12/15/19 1824   12/14/19 1422  ceFAZolin (ANCEF) 2-4 GM/100ML-% IVPB       Note to Pharmacy: Nyra Jabs   : cabinet override      12/14/19 1422 12/14/19 1654   12/14/19 0600  ceFAZolin (ANCEF) IVPB 2g/100 mL premix        2 g 200 mL/hr over 30 Minutes Intravenous On call to O.R. 12/13/19 2356 12/14/19 1724    .  Recent vital signs:  Vitals:   12/16/19 0037 12/16/19 0753  BP: (!) 101/56 127/60  Pulse: 64 70  Resp: 17 17  Temp: 97.8 F (36.6 C) 97.9 F (36.6 C)  SpO2: 98% 98%    Recent laboratory studies:  No results for input(s): WBC, HGB, HCT, PLT, K, CL, CO2, BUN, CREATININE, GLUCOSE, CALCIUM, LABPT, INR in the last 72 hours.  Diagnostic Studies: DG Knee Left Port  Result Date: 12/14/2019 CLINICAL DATA:  Left knee replacement. EXAM: PORTABLE LEFT KNEE - 1-2 VIEW COMPARISON:  None. FINDINGS: The left femoral and tibial components appear to be well situated. Expected postoperative changes are seen in the soft tissues anteriorly, including surgical drain in the suprapatellar region. IMPRESSION: Status post left total knee arthroplasty. Electronically Signed   By: Marijo Conception M.D.   On: 12/14/2019 20:07    Discharge Medications:   Allergies as of 12/16/2019      Reactions   Lisinopril Hives   Meperidine Other (See Comments)   BP dropped quickly      Medication List    TAKE these medications   celecoxib 200 MG capsule Commonly known as: CELEBREX Take 1 capsule (200 mg total) by mouth 2 (two) times daily.   diphenhydramine-acetaminophen 25-500 MG Tabs tablet Commonly known as: TYLENOL PM Take 1 tablet by mouth at bedtime.   enoxaparin 40 MG/0.4ML injection Commonly known as: LOVENOX Inject 0.4 mLs (40 mg total) into the skin daily for 14 days.   Fish Oil 1200 MG Cpdr Take 1,200 mg by mouth daily.   IRON PO Take 325 mg by mouth 3 (three) times a week.   multivitamin tablet Take 2 tablets by mouth daily. Gummie   oxyCODONE 5 MG immediate release tablet Commonly known as: Oxy IR/ROXICODONE Take 1 tablet (5 mg total) by mouth every 4 (four) hours as needed  for moderate pain (pain score 4-6).   Oyster Shell Calcium 500 + D 500-125 MG-UNIT Tabs Generic drug: Calcium Carbonate-Vitamin D Take 1 tablet by mouth daily.   pantoprazole 20 MG tablet Commonly known as: PROTONIX Take 1 tablet (20 mg total) by mouth daily.   traMADol 50 MG tablet Commonly known as: ULTRAM Take 50 mg by mouth daily as needed for severe pain. What changed: Another medication with the same name was added. Make sure you understand how and when to take each.   traMADol 50 MG tablet Commonly known as: ULTRAM Take 1 tablet (50 mg total) by mouth every 4 (four) hours as needed for moderate pain. What changed: You were already taking a medication with the same name, and this prescription was added. Make sure you understand how and when to take each.   valsartan 40 MG tablet Commonly known as: DIOVAN Take 40 mg by mouth daily.   vitamin B-12 1000 MCG tablet Commonly known as: CYANOCOBALAMIN Take 1,000 mg by mouth daily.   Vitamin D3 25 MCG (1000 UT) Caps Take 1,000 Units by mouth daily.   Voltaren 1 % Gel Generic drug: diclofenac Sodium Apply topically 4 (four) times daily as needed.  Durable Medical Equipment  (From admission, onward)         Start     Ordered   12/14/19 2110  DME Walker rolling  Once       Question:  Patient needs a walker to treat with the following condition  Answer:  Total knee replacement status   12/14/19 2109   12/14/19 2110  DME Bedside commode  Once       Question:  Patient needs a bedside commode to treat with the following condition  Answer:  Total knee replacement status   12/14/19 2109          Disposition: home with home health PT      Follow-up Information    [facility] On 12/29/2019.   Specialty: Orthopedic Surgery Why: at 9:15am Contact information: [place] [address] [phone]        Dereck Leep, MD On 11/26/2019.   Specialty: Orthopedic Surgery Why: at 1:45pm Contact information: [place] [address] [place], PA-C 12/16/2019, 11:06 AM

## 2019-12-15 NOTE — Progress Notes (Addendum)
Physical Therapy Treatment Patient Details Name: Debbie Chavez MRN: [id number] DOB: [birth year] Today's Date: 12/15/2019    History of Present Illness Pt is a 69 y/o female POD1 s/p L TKA. PMHx includes HTN, HLD, hx breast cancer, GERD, anemia, arthritis, and Barrett's esophagus.    PT Comments    Pt seated in recliner chair with daughter and husband present. Pt agreeable to PT session. Pt negotiated 4 steps backwards using RW as pt's steps to enter home have no rails with min A for safety and for walker management. Pt ambulated a total of 300 feet throughout session with slightly decreased gait speed from this morning's session secondary to pt reported "stiffness" and slight pain. Pt continues to exhibit safe transfers requiring at most CGA. Bed mobility with supervision for lines however pt required no physical assistance with LLE management into bed. Pt making good progress toward goals. Pt has great family support in daughter and husband who were present throughout session. Will continue to follow pt this acute stay and progress as tolerated.  This session over seen by Chesley Noon PTA in coordination with Vale Haven , SPT  Agree with assessment.   Follow Up Recommendations  Home health PT     Equipment Recommendations  None recommended by PT    Recommendations for Other Services       Precautions / Restrictions Precautions Precautions: Fall Restrictions LLE Weight Bearing: Weight bearing as tolerated    Mobility  Bed Mobility Overal bed mobility: Needs Assistance Bed Mobility: Sit to Supine       Sit to supine: Supervision   General bed mobility comments: supervision for safety with IV and hemovac; pt able to elevate LLE onto bed without physical assistance  Transfers Overall transfer level: Needs assistance Equipment used: Rolling walker (2 wheeled) Transfers: Sit to/from Stand Sit to Stand: Min guard         General transfer comment: pt able to initiate  transfers; CGA for safety  Ambulation/Gait Ambulation/Gait assistance: Min guard Gait Distance (Feet): 300 Feet Assistive device: Rolling walker (2 wheeled) Gait Pattern/deviations: Step-through pattern Gait velocity: decreased from this morning (10' in 7")   General Gait Details: pt with reciprocal gait pattern with proper heel strike and toe off; no LOB noted   Stairs Stairs: Yes Stairs assistance: Min assist Stair Management: Backwards;Step to pattern;With walker Number of Stairs: 4 General stair comments: min A for steadying and safety along with walker management; pt required demonstration and verbal cues for proper technique   Wheelchair Mobility    Modified Rankin (Stroke Patients Only)       Balance                                            Cognition Arousal/Alertness: Awake/alert Behavior During Therapy: WFL for tasks assessed/performed Overall Cognitive Status: Within Functional Limits for tasks assessed                                        Exercises Other Exercises Other Exercises: Pt given and educated on exercise booklet including reps and how many times a day to perform.    General Comments        Pertinent Vitals/Pain Pain Assessment: 0-10 Pain Score: 3  Pain Location: L knee Pain Descriptors / Indicators:  Discomfort (stiffness) Pain Intervention(s): Monitored during session;Repositioned;Ice applied    Home Living                      Prior Function            PT Goals (current goals can now be found in the care plan section) Acute Rehab PT Goals Patient Stated Goal: go home tomorrow and recover PT Goal Formulation: With patient Time For Goal Achievement: 12/29/19 Potential to Achieve Goals: Good Progress towards PT goals: Progressing toward goals    Frequency    BID      PT Plan Current plan remains appropriate    Co-evaluation              AM-PAC PT "6 Clicks" Mobility    Outcome Measure  Help needed turning from your back to your side while in a flat bed without using bedrails?: A Little Help needed moving from lying on your back to sitting on the side of a flat bed without using bedrails?: A Little Help needed moving to and from a bed to a chair (including a wheelchair)?: A Little Help needed standing up from a chair using your arms (e.g., wheelchair or bedside chair)?: A Little Help needed to walk in hospital room?: A Little Help needed climbing 3-5 steps with a railing? : A Little 6 Click Score: 18    End of Session Equipment Utilized During Treatment: Gait belt Activity Tolerance: Patient tolerated treatment well Patient left: in bed;with call bell/phone within reach;with bed alarm set;with family/visitor present;with SCD's reapplied Nurse Communication: Mobility status PT Visit Diagnosis: Unsteadiness on feet (R26.81);Muscle weakness (generalized) (M62.81);Other (comment)     Time: 3300-7622 PT Time Calculation (min) (ACUTE ONLY): 20 min  Charges:                        Vale Haven, SPT   Vale Haven 12/15/2019, 3:34 PM

## 2019-12-15 NOTE — TOC Initial Note (Signed)
Transition of Care [facility]) - Initial/Assessment Note    Patient Details  Name: Debbie Chavez MRN: [id number] Date of Birth: 01-05-1951  [facility] CM/SW Contact:    Elease Hashimoto, LCSW Phone Number: 12/15/2019, 9:06 AM  Clinical Narrative:  Met with pt and daughter and husband at the bedside to discuss discharge needs. Both can assist her at discharge if needed. Have ordered rw only need via [facility] for PT follow up. Pt hopes to resume her independence once heals from this surgery.             Expected Discharge Plan: [place] Barriers to Discharge: Continued Medical Work up   Patient Goals and CMS Choice Patient states their goals for this hospitalization and ongoing recovery are:: I hope to do well today in therapies CMS Medicare.gov Compare Post Acute Care list provided to:: Patient Choice offered to / list presented to : Patient  Expected Discharge Plan and Services Expected Discharge Plan: [place] In-house Referral: Clinical Social Work   Post Acute Care Choice: Home Health, Durable Medical Equipment Living arrangements for the past 2 months: [place]                 DME Arranged: Walker rolling DME Agency: [facility] Date DME Agency Contacted: 12/15/19 Time DME Agency Contacted: [phone] Representative spoke with at DME Agency: [place]: PT [place]: [facility] Date [place]: 12/15/19 Time [place]: [phone] Representative spoke with at [place]: teresa  Prior Living Arrangements/Services Living arrangements for the past 2 months: [place] with:: Spouse Patient language and need for interpreter reviewed:: No Do you feel safe going back to the place where you live?: Yes      Need for Family Participation in Patient Care: Yes (Comment) Care giver support system in place?: Yes (comment) Current home services: DME (borrow 3  in 1) Criminal Activity/Legal Involvement Pertinent to Current Situation/Hospitalization: No - Comment as needed  Activities of Daily Living Home Assistive Devices/Equipment: Eyeglasses, Blood pressure cuff ADL Screening (condition at time of admission) Patient's cognitive ability adequate to safely complete daily activities?: Yes Is the patient deaf or have difficulty hearing?: No Does the patient have difficulty seeing, even when wearing glasses/contacts?: No Does the patient have difficulty concentrating, remembering, or making decisions?: No Patient able to express need for assistance with ADLs?: Yes Does the patient have difficulty dressing or bathing?: No Independently performs ADLs?: Yes (appropriate for developmental age) Does the patient have difficulty walking or climbing stairs?: No Weakness of Legs: None (pain left knee) Weakness of Arms/Hands: None  Permission Sought/Granted Permission sought to share information with : Facility Sport and exercise psychologist, Family Supports Permission granted to share information with : Yes, Verbal Permission Granted  Share Information with NAME: Belenda Cruise  Permission granted to share info w AGENCY: [facility]  Permission granted to share info w Relationship: husband  Permission granted to share info w Contact Information: teresa  Emotional Assessment Appearance:: Appears stated age Attitude/Demeanor/Rapport: Engaged, Gracious Affect (typically observed): Adaptable, Accepting Orientation: : Oriented to Self, Oriented to Place, Oriented to  Time, Oriented to Situation Alcohol / Substance Use: Never Used Psych Involvement: No (comment)  Admission diagnosis:  Total knee replacement status [Z96.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 Postmenopausal osteoporosis 06/10/2018  . Special screening for malignant neoplasms, colon   .  Benign neoplasm of ascending colon   . Intestinal lump    . Diverticulosis of large intestine without diverticulitis   . Abnormal CT scan, stomach   . Barrett's esophagus without dysplasia   . Stomach irritation   . Hiatal hernia   . Breast cancer ([facility]) 07/19/2017  . Hypertension, essential, benign 07/19/2017  . Primary osteoarthritis of right knee 07/18/2017  . Anemia, unspecified 12/04/2013  . Arthritis involving multiple sites 12/04/2013  . Hyperlipidemia, unspecified 12/04/2013   PCP:  Feldpausch, Dale E, MD Pharmacy:   [id number] - [place], [place] - [address] [place] [address] [place] [address] [place] [address] Phone: [phone] Fax: [phone]  [facility] - [place], [place] - [address] [place] [address] Phone: [phone] Fax: [phone]     Social Determinants of Health (SDOH) Interventions    Readmission Risk Interventions No flowsheet data found.  

## 2019-12-15 NOTE — Evaluation (Signed)
Occupational Therapy Evaluation Patient Details Name: Debbie Chavez MRN: [id number] DOB: 07-20-1950 Today's Date: 12/15/2019    History of Present Illness 69yo female POD1 s/p L TKA. PMHx includes HTN, HLD, hx breast cancer, GERD, anemia, arthritis, and Barrett's esophagus.   Clinical Impression   Pt seen for OT evaluation this date, POD#1 from above surgery. Pt was independent in all ADL and ADL mobility prior to surgery, however pain limited. Pt is eager to return to PLOF with less pain and improved safety and independence. Pt currently requires PRN minimal assist for LB dressing and bathing while in seated position due to pain and limited AROM of L knee. Pt/family instructed in polar care mgt, falls prevention strategies, home/routines modifications, DME/AE for LB bathing and dressing tasks, and compression stocking mgt. Pt/family verbalized understanding. Pt performed bed mobility with modified independence, demonstrating relatively good L knee flexion despite bandaging. CGA for ADL transfers with initial cues for technique and pt demonstrating excellent carryover. Minimal pain throughout and no overt balance deficits. Handout provided to support recall and carryover of education/training provided. Pt/family verbalized understanding and deny additional needs at this time. Do not anticipate pt to require additional skilled acute OT needs; not recommending OT post-discharge.       Follow Up Recommendations  No OT follow up    Equipment Recommendations  None recommended by OT    Recommendations for Other Services       Precautions / Restrictions Precautions Precautions: Fall Restrictions Weight Bearing Restrictions: Yes LLE Weight Bearing: Weight bearing as tolerated      Mobility Bed Mobility Overal bed mobility: Modified Independent                Transfers Overall transfer level: Needs assistance Equipment used: Rolling walker (2 wheeled) Transfers: Sit to/from  Stand Sit to Stand: Min guard         General transfer comment: initial cues for hand placement, foot placement, and scooting closer to EOB prior to attempt with improved technique and pt reporting improvement in ability with 2nd trial using learned techniques    Balance Overall balance assessment: Mild deficits observed, not formally tested                                         ADL either performed or assessed with clinical judgement   ADL Overall ADL's : Needs assistance/impaired                                       General ADL Comments: PRN Min A for LB ADL tasks and Min-Mod A for compression stocking mgt and polar care mgt, sup-CGA for ADL transfers; family able to provide needed level of assist     Vision Baseline Vision/History: Wears glasses Wears Glasses: At all times Patient Visual Report: No change from baseline       Perception     Praxis      Pertinent Vitals/Pain Pain Assessment: 0-10 Pain Score: 4  Pain Location: L knee, 4/10 with mobility efforts Pain Descriptors / Indicators: Aching Pain Intervention(s): Limited activity within patient's tolerance;Monitored during session;Premedicated before session;Repositioned;Ice applied     Hand Dominance Right   Extremity/Trunk Assessment Upper Extremity Assessment Upper Extremity Assessment: Overall WFL for tasks assessed   Lower Extremity Assessment Lower Extremity Assessment: Overall [facility]  for tasks assessed (expected post-op LLE strength/ROM deficits)   Cervical / Trunk Assessment Cervical / Trunk Assessment: Normal   Communication Communication Communication: No difficulties   Cognition Arousal/Alertness: Awake/alert Behavior During Therapy: WFL for tasks assessed/performed Overall Cognitive Status: Within Functional Limits for tasks assessed                                     General Comments       Exercises Other Exercises Other Exercises:  Pt/family educated in polar care mgt, compression stocking mgt, falls prevention, AE/DME for ADL, home/routines modifications, and precautions following L TKA. Handout provided to support recall and carryover. Pt/family verbalized understanding.   Shoulder Instructions      Home Living Family/patient expects to be discharged to:: Private residence Living Arrangements: Spouse/significant other Available Help at Discharge: Family;Available 24 hours/day Type of Home: House             Bathroom Shower/Tub: Walk-in shower;Tub/shower unit   Bathroom Toilet: Handicapped height     Home Equipment: Bedside commode;Adaptive equipment;Shower seat - built in [employer]: [place] Additional Comments: has BSC she can borrow from daughter      Prior Functioning/Environment Level of Independence: Independent        Comments: Pt indep in all aspects, working part time with [employer], no falls in past 12 months.        OT Problem List: Decreased strength;Pain;Decreased range of motion      OT Treatment/Interventions:      OT Goals(Current goals can be found in the care plan section) Acute Rehab OT Goals Patient Stated Goal: go home tomorrow and recover OT Goal Formulation: All assessment and education complete, DC therapy  OT Frequency:     Barriers to D/C:            Co-evaluation              AM-PAC OT "6 Clicks" Daily Activity     Outcome Measure Help from another person eating meals?: None Help from another person taking care of personal grooming?: None Help from another person toileting, which includes using toliet, bedpan, or urinal?: A Little Help from another person bathing (including washing, rinsing, drying)?: A Little Help from another person to put on and taking off regular upper body clothing?: None Help from another person to put on and taking off regular lower body clothing?: A Little 6 Click Score: 21   End of Session Equipment Utilized  During Treatment: Gait belt;Rolling walker  Activity Tolerance: Patient tolerated treatment well Patient left: in chair;with call bell/phone within reach;with chair alarm set;with family/visitor present;Other (comment) (rolled towel under L ankle, polar care in place)  OT Visit Diagnosis: Other abnormalities of gait and mobility (R26.89);Pain Pain - Right/Left: Left Pain - part of body: Knee                Time: 0370-4888 OT Time Calculation (min): 32 min Charges:  OT General Charges $OT Visit: 1 Visit OT Evaluation $OT Eval Low Complexity: 1 Low OT Treatments $Self Care/Home Management : 23-37 mins  Jeni Salles, MPH, MS, OTR/L ascom [phone] 12/15/19, 9:13 AM

## 2019-12-15 NOTE — Progress Notes (Signed)
ORTHOPAEDICS PROGRESS NOTE  PATIENT NAME: Debbie Chavez DOB: 23-Nov-1950  MRN: [id number]  POD # 1: Left total knee arthroplasty  Subjective: The patient rested well last night.  Pain is been under good control.  She denies any nausea or vomiting. Her husband is at bedside this morning.  Objective: Vital signs in last 24 hours: Temp:  [97.2 F (36.2 C)-98 F (36.7 C)] 97.5 F (36.4 C) (06/22 0731) Pulse Rate:  [59-99] 59 (06/22 0731) Resp:  [13-25] 17 (06/22 0731) BP: (82-117)/(50-72) 112/57 (06/22 0731) SpO2:  [79 %-100 %] 100 % (06/22 0731) Weight:  [78.5 kg] 78.5 kg (06/21 1422)  Intake/Output from previous day: 06/21 0701 - 06/22 0700 In: 2259.1 [P.O.:120; I.V.:1639.1; IV Piggyback:500] Out: 380 [Urine:150; Drains:180; Blood:50]  No results for input(s): WBC, HGB, HCT, PLT, K, CL, CO2, BUN, CREATININE, GLUCOSE, CALCIUM, LABPT, INR in the last 72 hours.  EXAM General: Well-developed well-nourished female seen in no apparent discomfort. Lungs: clear to auscultation Cardiac: normal rate and regular rhythm Abdomen: Soft, nontender, nondistended.  Bowel sounds are present. Left lower extremity: Polar Care and Hemovac drains are intact and functioning.  The patient is able to perform an independent straight leg raise.  Dressing is dry and intact.  Homans test is negative. Neurologic: Awake, alert, and oriented.  Sensory and motor function are intact.  Assessment: Left total knee arthroplasty  Secondary diagnoses: Hypertension Hiatal hernia Gastroesophageal reflux disease Anemia  Plan: Notes from physical therapy were reviewed. Today's goals were reviewed with the patient. Continue physical therapy and Occupational Therapy as per total knee arthroplasty rehab protocol. Plan is to go Home after hospital stay. DVT Prophylaxis - Lovenox, Foot Pumps and TED hose  Jerre Diguglielmo P. Holley Bouche M.D.

## 2019-12-15 NOTE — Anesthesia Postprocedure Evaluation (Signed)
Anesthesia Post Note  Patient: Debbie Chavez  Procedure(s) Performed: COMPUTER ASSISTED TOTAL KNEE ARTHROPLASTY (Left Knee)  Patient location during evaluation: Nursing Unit Anesthesia Type: Combined General/Spinal Level of consciousness: oriented and awake and alert Pain management: pain level controlled Vital Signs Assessment: post-procedure vital signs reviewed and stable Respiratory status: spontaneous breathing and respiratory function stable Cardiovascular status: blood pressure returned to baseline and stable Postop Assessment: no headache, no backache, no apparent nausea or vomiting and patient able to bend at knees Anesthetic complications: no   No complications documented.   Last Vitals:  Vitals:   12/15/19 0407 12/15/19 0731  BP: (!) 99/55 (!) 112/57  Pulse: 62 (!) 59  Resp: 18 17  Temp: (!) 36.4 C (!) 36.4 C  SpO2: 98% 100%    Last Pain:  Vitals:   12/15/19 0731  TempSrc: Oral  PainSc:                  [name]

## 2019-12-16 MED ORDER — TRAMADOL HCL 50 MG PO TABS: 50.0000 mg | ORAL_TABLET | ORAL | 0 refills | Status: DC | PRN

## 2019-12-16 MED ORDER — ENOXAPARIN SODIUM 40 MG/0.4ML ~~LOC~~ SOLN
40.0000 mg | SUBCUTANEOUS | 0 refills | Status: DC
Start: 2019-12-16 — End: 2020-12-13

## 2019-12-16 MED ORDER — CELECOXIB 200 MG PO CAPS: 200.0000 mg | ORAL_CAPSULE | Freq: Two times a day (BID) | ORAL | 0 refills | Status: DC

## 2019-12-16 MED ORDER — OXYCODONE HCL 5 MG PO TABS: 5.0000 mg | ORAL_TABLET | ORAL | 0 refills | Status: DC | PRN

## 2019-12-16 NOTE — Progress Notes (Signed)
  Subjective: 2 Days Post-Op Procedure(s) (LRB): COMPUTER ASSISTED TOTAL KNEE ARTHROPLASTY (Left) Patient reports pain as well-controlled.   Patient is well, and has had no acute complaints or problems Plan is to go Home after hospital stay. Negative for chest pain and shortness of breath Fever: no Gastrointestinal: negative for nausea and vomiting.   Patient has not had a bowel movement.  Objective: Vital signs in last 24 hours: Temp:  [97.8 F (36.6 C)-98 F (36.7 C)] 97.9 F (36.6 C) (06/23 0753) Pulse Rate:  [64-70] 70 (06/23 0753) Resp:  [16-17] 17 (06/23 0753) BP: (93-127)/(54-60) 127/60 (06/23 0753) SpO2:  [97 %-98 %] 98 % (06/23 0753)  Intake/Output from previous day:  Intake/Output Summary (Last 24 hours) at 12/16/2019 1103 Last data filed at 12/16/2019 1024 Gross per 24 hour  Intake 1930.04 ml  Output 79 ml  Net 1851.04 ml    Intake/Output this shift: Total I/O In: 360 [P.O.:360] Out: -   Labs: No results for input(s): HGB in the last 72 hours. No results for input(s): WBC, RBC, HCT, PLT in the last 72 hours. No results for input(s): NA, K, CL, CO2, BUN, CREATININE, GLUCOSE, CALCIUM in the last 72 hours. No results for input(s): LABPT, INR in the last 72 hours.   EXAM General - Patient is Alert, Appropriate and Oriented Extremity - Neurovascular intact Dorsiflexion/Plantar flexion intact Compartment soft Dressing/Incision -Postoperative dressing remains in place., Polar Care in place and working. , Hemovac in place. Following removal of post-op dressing, minimal sanguinous drainage noted.  Motor Function - intact, moving foot and toes well on exam.  Cardiovascular- Regular rate and rhythm, no murmurs/rubs/gallops Respiratory- Lungs clear to auscultation bilaterally Gastrointestinal- soft, nontender and active bowel sounds   Assessment/Plan: 2 Days Post-Op Procedure(s) (LRB): COMPUTER ASSISTED TOTAL KNEE ARTHROPLASTY (Left) Active Problems:   Total  knee replacement status  Estimated body mass index is 30.65 kg/m as calculated from the following:   Height as of this encounter: 5\' 3"  (1.6 m).   Weight as of this encounter: 78.5 kg. Advance diet Up with therapy Discharge home with home health  Hemovac removed. Post-op dressing removed.   DVT Prophylaxis - Lovenox, Ted hose and foot pumps Weight-Bearing as tolerated to left leg  Cassell Smiles, PA-C [facility] Orthopaedic Surgery 12/16/2019, 11:03 AM

## 2019-12-16 NOTE — Progress Notes (Signed)
Physical Therapy Treatment Patient Details Name: Debbie Chavez MRN: [id number] DOB: December 15, 1950 Today's Date: 12/16/2019    History of Present Illness Pt is a 69 y/o female POD1 s/p L TKA. PMHx includes HTN, HLD, hx breast cancer, GERD, anemia, arthritis, and Barrett's esophagus.    PT Comments    Pt received in bed this morning with husband at bedside. Pt agreeable to PT treatment. Pt educated on exercise booklet and repetition progression. Pt reports minimal pain this morning d/t receiving oxy this morning however did report 8/10 L knee pain performing SAQ therefore limited reps to pt tolerance. Pt negotiated 4 steps using RW and backward technique. Min A for safety and steadying of RW. Pt ambulated 160 feet using RW with CGA and performed sit <> stand transfers with CGA. Pt demonstrated safe technique with bed mobility, transfers, ambulation, and stair negotiation and cleared by PT standpoint.   Follow Up Recommendations  Home health PT     Equipment Recommendations  None recommended by PT    Recommendations for Other Services       Precautions / Restrictions Precautions Precautions: Fall Restrictions Weight Bearing Restrictions: Yes LLE Weight Bearing: Weight bearing as tolerated    Mobility  Bed Mobility Overal bed mobility: Needs Assistance Bed Mobility: Supine to Sit       Sit to supine: Supervision   General bed mobility comments: SBA for LLE however pt able to manage over EOB without assistance, increased time to perform  Transfers Overall transfer level: Needs assistance Equipment used: Rolling walker (2 wheeled) Transfers: Sit to/from Stand Sit to Stand: Min guard         General transfer comment: pt able to initiate transfers; CGA for safety  Ambulation/Gait Ambulation/Gait assistance: Min guard Gait Distance (Feet): 160 Feet Assistive device: Rolling walker (2 wheeled) Gait Pattern/deviations: Step-through pattern Gait velocity: decreased   General  Gait Details: reciprocal gait pattern with good heel toe progression through stance phase   Stairs Stairs: Yes Stairs assistance: Min assist Stair Management: Backwards;Step to pattern;With walker Number of Stairs: 4 General stair comments: min A for walker management and steadying; husband present and educated on stair negotiation    Wheelchair Mobility    Modified Rankin (Stroke Patients Only)       Balance                                            Cognition Arousal/Alertness: Awake/alert Behavior During Therapy: WFL for tasks assessed/performed Overall Cognitive Status: Within Functional Limits for tasks assessed                                        Exercises Total Joint Exercises Ankle Circles/Pumps: AROM;Left;15 reps;Supine Quad Sets: AROM;Left;15 reps;Supine Short Arc Quad: AROM;Left;5 reps;Supine;Limitations Short Arc Quad Limitations: limited d/t increased pain to 7-8/10 Hip ABduction/ADduction: AROM;Left;15 reps;Supine Goniometric ROM: L knee: 0-95 degrees Other Exercises Other Exercises: Reviewed exercise booklet and repetition progression    General Comments        Pertinent Vitals/Pain Pain Assessment: 0-10 Pain Score: 8  Pain Location: L knee (during SAQ while in bed) Pain Descriptors / Indicators: Sore;Discomfort Pain Intervention(s): Limited activity within patient's tolerance;Repositioned;Ice applied    Home Living  Prior Function            PT Goals (current goals can now be found in the care plan section) Acute Rehab PT Goals Patient Stated Goal: go home and recover PT Goal Formulation: With patient Time For Goal Achievement: 12/29/19 Potential to Achieve Goals: Good Progress towards PT goals: Progressing toward goals    Frequency    BID      PT Plan Current plan remains appropriate    Co-evaluation              AM-PAC PT "6 Clicks" Mobility    Outcome Measure  Help needed turning from your back to your side while in a flat bed without using bedrails?: A Little Help needed moving from lying on your back to sitting on the side of a flat bed without using bedrails?: A Little Help needed moving to and from a bed to a chair (including a wheelchair)?: A Little Help needed standing up from a chair using your arms (e.g., wheelchair or bedside chair)?: A Little Help needed to walk in hospital room?: A Little Help needed climbing 3-5 steps with a railing? : A Little 6 Click Score: 18    End of Session Equipment Utilized During Treatment: Gait belt Activity Tolerance: Patient tolerated treatment well Patient left: in chair;with call bell/phone within reach;with chair alarm set;with family/visitor present;with SCD's reapplied Nurse Communication: Mobility status PT Visit Diagnosis: Unsteadiness on feet (R26.81);Muscle weakness (generalized) (M62.81);Other (comment)     Time: 4287-6811 PT Time Calculation (min) (ACUTE ONLY): 31 min  Charges:                        Vale Haven, SPT   Vale Haven 12/16/2019, 12:03 PM

## 2019-12-16 NOTE — Plan of Care (Signed)

## 2019-12-16 NOTE — TOC Transition Note (Signed)
Transition of Care [facility]) - CM/SW Discharge Note   Patient Details  Name: Debbie Chavez MRN: [id number] Date of Birth: 03/02/1951  Transition of Care [facility]) CM/SW Contact:  Elease Hashimoto, LCSW Phone Number: 12/16/2019, 9:30 AM   Clinical Narrative:  Equipment delivered and ready for discharge home with husband. Husband present for stair education. [facility] to follow up at discharge. Ready to go home today.     Final next level of care: [place] Barriers to Discharge: Barriers Resolved   Patient Goals and CMS Choice Patient states their goals for this hospitalization and ongoing recovery are:: I hope to do well today in therapies CMS Medicare.gov Compare Post Acute Care list provided to:: Patient Choice offered to / list presented to : Patient  Discharge Placement                Patient to be transferred to facility by: Gregory-husband via car Name of family member notified: Belenda Cruise here at hospital Patient and family notified of of transfer: 12/16/19  Discharge Plan and Services In-house Referral: Clinical Social Work   Post Acute Care Choice: Home Health, Durable Medical Equipment          DME Arranged: Gilford Rile rolling DME Agency: [employer] Date DME Agency Contacted: 12/15/19 Time DME Agency Contacted: [phone] Representative spoke with at DME Agency: [place]: PT [place]: [facility]) Date [place]: 12/15/19 Time [place]: [phone] Representative spoke with at [place]: teresa  Social Determinants of Health ([place]) Interventions     Readmission Risk Interventions No flowsheet data found.

## 2019-12-16 NOTE — Plan of Care (Signed)
  Problem: Health Behavior/Discharge Planning: Goal: Ability to manage health-related needs will improve Outcome: Progressing   

## 2019-12-16 NOTE — Progress Notes (Signed)
Pt ready for discharge home today per MD. Patient assessment unchanged from this morning. Had BM and met PT goals. Reviewed discharge instructions and prescriptions with pt and her husband & daughter; all questions answered and pt verbalized understanding. PIV removed, VSS. Pt is waiting for [facility] to be delivered to room and then be d/c.   Ethelda Chick

## 2019-12-16 NOTE — Progress Notes (Signed)
Ch visited with Pt and Pt's husband Marya Amsler, as part of routine rounding. Pt was on the chair. Ch checked in on Pt, and Pt spoke about knee replacement and PT. Pt reported that she is anxious to get back home. Pt requested prayer. Ch prayed with Pt and Pt's husband. Marya Amsler said they are grateful for blessings, and that they are part of the [employer].

## 2019-12-21 ENCOUNTER — Encounter: Payer: Medicare Other | Admitting: Physical Therapy

## 2020-04-04 ENCOUNTER — Ambulatory Visit: Payer: Medicare Other | Attending: Internal Medicine

## 2020-04-04 DIAGNOSIS — Z23 Encounter for immunization: Secondary | ICD-10-CM

## 2020-04-04 NOTE — Progress Notes (Signed)
   Covid-19 Vaccination Clinic  Name:  Debbie Chavez    MRN: [id number] DOB: 23-Jan-1951  04/04/2020  [name] was observed post Covid-19 immunization for 15 minutes without incident. She was provided with Vaccine Information Sheet and instruction to access the V-Safe system.   [name] was instructed to call 911 with any severe reactions post vaccine: Marland Kitchen Difficulty breathing  . Swelling of face and throat  . A fast heartbeat  . A bad rash all over body  . Dizziness and weakness

## 2020-06-23 ENCOUNTER — Other Ambulatory Visit: Payer: Self-pay | Admitting: Gastroenterology

## 2020-12-12 ENCOUNTER — Telehealth: Payer: Self-pay | Admitting: Gastroenterology

## 2020-12-12 NOTE — Telephone Encounter (Signed)
As f/u scheduled for tomorrow will hold on refill as she is not completely out

## 2020-12-12 NOTE — Telephone Encounter (Signed)
Patient called asking for increase in Pantaprozole.  It is not taking care of her Reflux

## 2020-12-13 ENCOUNTER — Other Ambulatory Visit: Payer: Self-pay

## 2020-12-13 ENCOUNTER — Ambulatory Visit (INDEPENDENT_AMBULATORY_CARE_PROVIDER_SITE_OTHER): Payer: Medicare Other | Admitting: Gastroenterology

## 2020-12-13 ENCOUNTER — Encounter: Payer: Self-pay | Admitting: Gastroenterology

## 2020-12-13 VITALS — BP 124/77 | HR 70 | Temp 98.2°F | Wt 177.8 lb

## 2020-12-13 DIAGNOSIS — K2270 Barrett's esophagus without dysplasia: Secondary | ICD-10-CM | POA: Diagnosis not present

## 2020-12-13 DIAGNOSIS — K219 Gastro-esophageal reflux disease without esophagitis: Secondary | ICD-10-CM | POA: Diagnosis not present

## 2020-12-13 MED ORDER — PANTOPRAZOLE SODIUM 20 MG PO TBEC: DELAYED_RELEASE_TABLET | ORAL | 0 refills | Status: DC

## 2020-12-13 NOTE — Progress Notes (Signed)
[name], MD [address]  [place], [place] [address]  Main: [phone]  Fax: [phone]   Primary Care Physician: Sofie Hartigan, MD   Chief Complaint  Patient presents with   Follow-up   Medication Refill    Pt is requesting to change dose on Protonix as current dose is not working as well... [facility] day supply [facility]    HPI: Debbie Chavez is a 70 y.o. female here for follow-up of Barrett's esophagus.  Patient has been on Protonix 20 mg once daily and over the last 1 to 2 months has noted breakthrough symptoms at least 3-4 times a week and is having to use Tums most nights as breakthrough symptoms are usually occurring after supper, even before she lays down.  She has started eating less at supper as well.  No dysphagia.  No weight loss.  No nausea or vomiting.  Previous history: Last EGD with Debbie. Allen Norris in March 2021, with Barrett's esophagus biopsied every 1 cm and did not show any dysplasia.  Initially seen for isolated episode of abdominal pain, and abnormal CT scan showing duodenal thickening.  She has since undergone EGD and colonoscopy in August 2019.  EGD showed a normal duodenum.  Erythematous mucosa in the antrum.  1 cm hiatal hernia.  Barrett's esophagus with circumferential Barrett's from 37 to 38 cm and maximal extent to 35 cm.  C1 M2 Barrett's.  Biopsies did not show any dysplasia.    Colonoscopy showed 2, 3 to 4 mm polyps in the ascending colon.  Removed with cold biopsy forceps.  Taken for the ileocecal valve, biopsied.  Diverticulosis.  Polypoid ileocolonic mucosa with lymphoid aggregate was seen on the ileocecal valve biopsies.  Tubular adenoma was seen on the polyps.  Repeat colonoscopy recommended in 5 years.  Previous discussions in regard to osteoporosis and Protonix as documented in previous notes:  She states she was recently diagnosed with osteoporosis and is being started on medications.  Discussed the risks of PPI use  associated with bone loss.  However, given her C1 M2 Barrett's esophagus, discontinuing PPI completely can lead to worsening of Barrett's and we discussed the benefits and risks of continuing the medication at a lower dose and she would like to continue the PT at this time. (Risks of PPI use were discussed with patient including bone loss, C. Diff diarrhea, pneumonia, infections, CKD, electrolyte abnormalities.  If clinically possible based on symptoms, goal would be to maintain patient on the lowest dose possible, or discontinue the medication with institution of acid reflux lifestyle modifications over time. Pt. Verbalizes understanding and chooses to continue the medication.)  In addition, recent study "The incidence of osteoporotic fractures was not increased in Barrett's oesophagus patients compared to the general population. Additionally, PPI use was not associated with increased fracture risk regardless of the duration of therapy or dose. ([email or web address])"    ROS: All ROS reviewed a negative except as per HPI   Past Medical History:  Diagnosis Date   Anemia    Arthritis    hands, knees   Cancer ([facility]) 06/2007   right breast    GERD (gastroesophageal reflux disease)    History of hiatal hernia    Hypertension     Past Surgical History:  Procedure Laterality Date   BREAST SURGERY Right 2007   lumpectomy   COLONOSCOPY     COLONOSCOPY WITH PROPOFOL N/A 08/20/2017   Procedure: COLONOSCOPY WITH PROPOFOL;  Surgeon: Bonna Gains,  Lennette Bihari, MD;  Location: [facility];  Service: Endoscopy;  Laterality: N/A;   ESOPHAGOGASTRODUODENOSCOPY (EGD) WITH PROPOFOL N/A 08/20/2017   Procedure: ESOPHAGOGASTRODUODENOSCOPY (EGD) WITH PROPOFOL;  Surgeon: Virgel Manifold, MD;  Location: [facility];  Service: Endoscopy;  Laterality: N/A;   ESOPHAGOGASTRODUODENOSCOPY (EGD) WITH PROPOFOL N/A 02/12/2018   Procedure: ESOPHAGOGASTRODUODENOSCOPY (EGD) WITH  PROPOFOL;  Surgeon: Virgel Manifold, MD;  Location: [facility];  Service: Endoscopy;  Laterality: N/A;   ESOPHAGOGASTRODUODENOSCOPY (EGD) WITH PROPOFOL N/A 09/14/2019   Procedure: ESOPHAGOGASTRODUODENOSCOPY (EGD) WITH BIOPSY;  Surgeon: Lucilla Lame, MD;  Location: [facility];  Service: Endoscopy;  Laterality: N/A;   KNEE ARTHROPLASTY Left 12/14/2019   Procedure: COMPUTER ASSISTED TOTAL KNEE ARTHROPLASTY;  Surgeon: Dereck Leep, MD;  Location: [facility];  Service: Orthopedics;  Laterality: Left;   MASTECTOMY Bilateral 06/2007   no lumph nodes removed   POLYPECTOMY  08/20/2017   Procedure: POLYPECTOMY;  Surgeon: Virgel Manifold, MD;  Location: [facility];  Service: Endoscopy;;   shoulder Left    TUBAL LIGATION      Prior to Admission medications   Medication Sig Start Date End Date Taking? Authorizing Provider  Calcium Carbonate-Vitamin D 500-125 MG-UNIT TABS Take 1 tablet by mouth daily.    Yes [provider]  Cholecalciferol (VITAMIN D3) 25 MCG (1000 UT) CAPS Take 1,000 Units by mouth daily.   Yes [provider]  IRON PO Take 325 mg by mouth 3 (three) times a week.    Yes [provider]  Multiple Vitamin (MULTIVITAMIN) tablet Take 2 tablets by mouth daily. Gummie   Yes [provider]  Omega-3 Fatty Acids (FISH OIL) 1200 MG CPDR Take 1,200 mg by mouth daily.    Yes [provider]  traMADol (ULTRAM) 50 MG tablet Take 1 tablet (50 mg total) by mouth every 4 (four) hours as needed for moderate pain. 12/16/19  Yes Tamala Julian B, PA-C  valsartan (DIOVAN) 40 MG tablet Take 40 mg by mouth daily.   Yes [provider]  vitamin B-12 (CYANOCOBALAMIN) 1000 MCG tablet Take 1,000 mg by mouth daily.    Yes [provider]  pantoprazole (PROTONIX) 20 MG tablet Take 1 tablet (20 mg total) by mouth 2 (two) times daily for 60 days, THEN 1 tablet (20 mg total) daily. 12/13/20 03/13/21  Virgel Manifold, MD     Family History  Problem Relation Age of Onset   Cancer Mother        breast   Cancer Sister        breast     Social History   Tobacco Use   Smoking status: Never   Smokeless tobacco: Never  Vaping Use   Vaping Use: Never used  Substance Use Topics   Alcohol use: Yes    Comment: rare - Holidays   Drug use: No    Allergies as of 12/13/2020 - Review Complete 12/13/2020  Allergen Reaction Noted   Lisinopril Hives 09/08/2019   Meperidine Other (See Comments) 07/09/2017    Physical Examination:   BP 124/77   Pulse 70   Temp 98.2 F (36.8 C) (Oral)   Wt 177 lb 12.8 oz (80.6 kg)   BMI 31.50 kg/m   Constitutional: General:   Alert,  Well-developed, well-nourished, pleasant and cooperative in NAD BP 124/77   Pulse 70   Temp 98.2 F (36.8 C) (Oral)   Wt 177 lb 12.8 oz (80.6 kg)   BMI 31.50 kg/m   Eyes:  Sclera clear, no icterus.   Conjunctiva pink. PERRLA  Ears:  No scars, lesions or masses, Normal auditory acuity. Nose:  No deformity, discharge, or lesions. Mouth:  No deformity or lesions, oropharynx pink & moist.  Neck:  Supple; no masses or thyromegaly.  Respiratory: Normal respiratory effort, Normal percussion  Gastrointestinal:  Normal bowel sounds.  No bruits.  Soft, non-tender and non-distended without masses, hepatosplenomegaly or hernias noted.  No guarding or rebound tenderness.     Cardiac: No clubbing or edema.  No cyanosis. Normal posterior tibial pedal pulses noted.  Lymphatic:  No significant cervical or axillary adenopathy.  Psych:  Alert and cooperative. Normal mood and affect.  Musculoskeletal:  Normal gait. Head normocephalic, atraumatic. Symmetrical without gross deformities. 5/5 Upper and Lower extremity strength bilaterally.  Skin: Warm. Intact without significant lesions or rashes. No jaundice.  Neurologic:  Face symmetrical, tongue midline, Normal sensation to touch;  grossly normal neurologically.  Psych:  Alert and oriented  x3, Alert and cooperative. Normal mood and affect.   Labs:  Normal liver enzymes February 2022   Assessment and Plan:   Debbie Chavez is a 70 y.o. y/o female here for follow-up of Barrett's esophagus  Patient has had breakthrough symptoms over the last 1 to 2 months  Patient has a reclining bed at home but sleeps on it flat and does not recline it.  I have encouraged her to either reclined the bed or use the bed wedge pillow  Patient educated extensively on acid reflux lifestyle modification, including buying a bed wedge, not eating 3 hrs before bedtime, diet modifications, and handout given for the same.   We discussed that lifestyle modifications would help her decrease the amount of medication that she is having to take and she verbalized understanding  However, it is reasonable at this time to increase medication to twice a day for 1 to 2 months.  I have advised her to take Protonix twice a day for 1 month and on January 12, 2021, decrease it again to once a day and if symptoms remain well controlled remain on the once a day dosing for her Barrett's esophagus as we have extensively discussed on previous visits and on this visit.  However, if symptoms return, she can take the twice a day dosing for a total of 2 months and then decrease it again to once a day.  If symptoms remain uncontrolled or worsen on either dosing, patient advised to call us back and she verbalized understanding  (Risks of PPI use were discussed with patient including bone loss, C. Diff diarrhea, pneumonia, infections, CKD, electrolyte abnormalities.  Pt. Verbalizes understanding and chooses to continue the medication.)     Debbie Chavez

## 2021-06-14 ENCOUNTER — Other Ambulatory Visit: Payer: Self-pay

## 2021-06-14 ENCOUNTER — Ambulatory Visit (INDEPENDENT_AMBULATORY_CARE_PROVIDER_SITE_OTHER): Payer: Medicare Other | Admitting: Gastroenterology

## 2021-06-14 ENCOUNTER — Encounter: Payer: Self-pay | Admitting: Gastroenterology

## 2021-06-14 VITALS — BP 102/70 | HR 80 | Temp 98.3°F | Wt 179.0 lb

## 2021-06-14 DIAGNOSIS — K2270 Barrett's esophagus without dysplasia: Secondary | ICD-10-CM | POA: Diagnosis not present

## 2021-06-14 MED ORDER — PANTOPRAZOLE SODIUM 20 MG PO TBEC: 20.0000 mg | DELAYED_RELEASE_TABLET | Freq: Every day | ORAL | 1 refills | Status: DC

## 2021-06-14 NOTE — Progress Notes (Signed)
[name], MD [address]  [place]  [place], [place] [address]  Main: [phone]  Fax: [phone]   Primary Care Physician: Sofie Hartigan, MD   Chief Complaint  Patient presents with   Follow-up    Pt denies any new concerns  Barretts esophagus  HPI: Debbie Chavez is a 70 y.o. female with history of Barrett's esophagus here for follow-up.  Is taking Protonix 20 mg once a day without any breakthrough symptoms.  On previous visit was having some breakthrough symptoms and increased PPI dosing to twice daily for short-term and then decreased it again to once a day, and doing well on this.  No dysphagia, no nausea or vomiting.  Good appetite.  No altered bowel habits.  Previous history: Last EGD with Dr. Allen Norris in March 2021, with Barrett's esophagus biopsied every 1 cm and did not show any dysplasia.   Initially seen for isolated episode of abdominal pain, and abnormal CT scan showing duodenal thickening.  She has since undergone EGD and colonoscopy in August 2019.  EGD showed a normal duodenum.  Erythematous mucosa in the antrum.  1 cm hiatal hernia.  Barrett's esophagus with circumferential Barrett's from 37 to 38 cm and maximal extent to 35 cm.  C1 M2 Barrett's.  Biopsies did not show any dysplasia.    Colonoscopy showed 2, 3 to 4 mm polyps in the ascending colon.  Removed with cold biopsy forceps.  Taken for the ileocecal valve, biopsied.  Diverticulosis.  Polypoid ileocolonic mucosa with lymphoid aggregate was seen on the ileocecal valve biopsies.  Tubular adenoma was seen on the polyps.  Repeat colonoscopy recommended in 5 years.   Previous discussions in regard to osteoporosis and Protonix as documented in previous notes:   She states she was recently diagnosed with osteoporosis and is being started on medications.  Discussed the risks of PPI use associated with bone loss.  However, given her C1 M2 Barrett's esophagus, discontinuing PPI completely can lead to  worsening of Barrett's and we discussed the benefits and risks of continuing the medication at a lower dose and she would like to continue the PT at this time. (Risks of PPI use were discussed with patient including bone loss, C. Diff diarrhea, pneumonia, infections, CKD, electrolyte abnormalities.  If clinically possible based on symptoms, goal would be to maintain patient on the lowest dose possible, or discontinue the medication with institution of acid reflux lifestyle modifications over time. Pt. Verbalizes understanding and chooses to continue the medication.)  In addition, recent study "The incidence of osteoporotic fractures was not increased in Barretts oesophagus patients compared to the general population. Additionally, PPI use was not associated with increased fracture risk regardless of the duration of therapy or dose. ([email or web address])"    ROS: All ROS reviewed and negative except as per HPI   Past Medical History:  Diagnosis Date   Anemia    Arthritis    hands, knees   Cancer ([facility]) 06/2007   right breast    GERD (gastroesophageal reflux disease)    History of hiatal hernia    Hypertension     Past Surgical History:  Procedure Laterality Date   BREAST SURGERY Right 2007   lumpectomy   COLONOSCOPY     COLONOSCOPY WITH PROPOFOL N/A 08/20/2017   Procedure: COLONOSCOPY WITH PROPOFOL;  Surgeon: Virgel Manifold, MD;  Location: [facility];  Service: Endoscopy;  Laterality: N/A;   ESOPHAGOGASTRODUODENOSCOPY (EGD) WITH PROPOFOL N/A 08/20/2017   Procedure: ESOPHAGOGASTRODUODENOSCOPY (  EGD) WITH PROPOFOL;  Surgeon: Virgel Manifold, MD;  Location: [facility];  Service: Endoscopy;  Laterality: N/A;   ESOPHAGOGASTRODUODENOSCOPY (EGD) WITH PROPOFOL N/A 02/12/2018   Procedure: ESOPHAGOGASTRODUODENOSCOPY (EGD) WITH PROPOFOL;  Surgeon: Virgel Manifold, MD;  Location: [facility];  Service: Endoscopy;  Laterality: N/A;    ESOPHAGOGASTRODUODENOSCOPY (EGD) WITH PROPOFOL N/A 09/14/2019   Procedure: ESOPHAGOGASTRODUODENOSCOPY (EGD) WITH BIOPSY;  Surgeon: Lucilla Lame, MD;  Location: [facility];  Service: Endoscopy;  Laterality: N/A;   KNEE ARTHROPLASTY Left 12/14/2019   Procedure: COMPUTER ASSISTED TOTAL KNEE ARTHROPLASTY;  Surgeon: Dereck Leep, MD;  Location: [facility];  Service: Orthopedics;  Laterality: Left;   MASTECTOMY Bilateral 06/2007   no lumph nodes removed   POLYPECTOMY  08/20/2017   Procedure: POLYPECTOMY;  Surgeon: Virgel Manifold, MD;  Location: [facility];  Service: Endoscopy;;   shoulder Left    TUBAL LIGATION      Prior to Admission medications   Medication Sig Start Date End Date Taking? Authorizing Provider  Calcium Carbonate-Vitamin D 500-125 MG-UNIT TABS Take 1 tablet by mouth daily.    Yes [provider]  Cholecalciferol (VITAMIN D3) 25 MCG (1000 UT) CAPS Take 1,000 Units by mouth daily.   Yes [provider]  IRON PO Take 325 mg by mouth 3 (three) times a week.    Yes [provider]  Multiple Vitamin (MULTIVITAMIN) tablet Take 2 tablets by mouth daily. Gummie   Yes [provider]  Omega-3 Fatty Acids (FISH OIL) 1200 MG CPDR Take 1,200 mg by mouth daily.    Yes [provider]  traMADol (ULTRAM) 50 MG tablet Take 1 tablet (50 mg total) by mouth every 4 (four) hours as needed for moderate pain. 12/16/19  Yes Tamala Julian B, PA-C  valsartan (DIOVAN) 40 MG tablet Take 40 mg by mouth daily.   Yes [provider]  vitamin B-12 (CYANOCOBALAMIN) 1000 MCG tablet Take 1,000 mg by mouth daily.    Yes [provider]  pantoprazole (PROTONIX) 20 MG tablet Take 1 tablet (20 mg total) by mouth daily. 06/14/21 08/13/21  Virgel Manifold, MD    Family History  Problem Relation Age of Onset   Cancer Mother        breast   Cancer Sister        breast     Social History   Tobacco Use   Smoking status: Never    Smokeless tobacco: Never  Vaping Use   Vaping Use: Never used  Substance Use Topics   Alcohol use: Yes    Comment: rare - Holidays   Drug use: No    Allergies as of 06/14/2021 - Review Complete 06/14/2021  Allergen Reaction Noted   Lisinopril Hives 09/08/2019   Meperidine Other (See Comments) 07/09/2017    Physical Examination:  Constitutional: General:   Alert,  Well-developed, well-nourished, pleasant and cooperative in NAD BP 102/70    Pulse 80    Temp 98.3 F (36.8 C) (Oral)    Wt 179 lb (81.2 kg)    BMI 31.71 kg/m   Respiratory: Normal respiratory effort  Gastrointestinal:  Soft, non-tender and non-distended without masses, hepatosplenomegaly or hernias noted.  No guarding or rebound tenderness.     Cardiac: No clubbing or edema.  No cyanosis. Normal posterior tibial pedal pulses noted.  Psych:  Alert and cooperative. Normal mood and affect.  Musculoskeletal:  Normal gait. Head normocephalic, atraumatic. Symmetrical without gross deformities. 5/5 Lower extremity strength bilaterally.  Skin: Warm. Intact without significant lesions or rashes. No jaundice.  Neck: Supple, trachea midline  Lymph: No cervical lymphadenopathy  Psych:  Alert and oriented x3, Alert and cooperative. Normal mood and affect.  Labs: CMP     Component Value Date/Time   NA 140 12/08/2019 0934   K 4.1 12/08/2019 0934   CL 106 12/08/2019 0934   CO2 27 12/08/2019 0934   GLUCOSE 87 12/08/2019 0934   BUN 21 12/08/2019 0934   CREATININE 1.05 (H) 12/08/2019 0934   CREATININE 1.08 08/14/2011 0932   CALCIUM 9.0 12/08/2019 0934   PROT 7.1 12/08/2019 0934   PROT 7.1 08/14/2011 0932   ALBUMIN 4.1 12/08/2019 0934   ALBUMIN 4.2 08/14/2011 0932   AST 24 12/08/2019 0934   AST 25 08/14/2011 0932   ALT 20 12/08/2019 0934   ALT 33 08/14/2011 0932   ALKPHOS 51 12/08/2019 0934   ALKPHOS 107 08/14/2011 0932   BILITOT 1.8 (H) 12/08/2019 0934   BILITOT 1.0 08/28/2011 0833   GFRNONAA 55 (L)  12/08/2019 0934   GFRNONAA 55 (L) 08/14/2011 0932   GFRAA >60 12/08/2019 0934   GFRAA >60 08/14/2011 0932   Lab Results  Component Value Date   WBC 5.4 12/08/2019   HGB 11.6 (L) 12/08/2019   HCT 34.9 (L) 12/08/2019   MCV 96.1 12/08/2019   PLT 216 12/08/2019    Imaging Studies:   Assessment and Plan:   Debbie Chavez is a 70 y.o. y/o female her for follow up of barrett's esophagus  Pt is on low dose indefinite PPI therapy for barrett's esophagus based on current guidelines.   She is not having any breakthrough symptoms  However, we did discuss her mildly elevated creatinine this year and her PPI use. She states renal function is being monitored by PCP. She has not seen a nephrologist. Per uptodate:   "Kidney disease -- PPIs can cause acute interstitial nephritis (AIN) [69-72]. Similar to other cases of drug-induced AIN, AIN due to PPI use is not dose-dependent, and recurrence or exacerbation can occur with a second exposure to the same or a related drug...PPI use has also been associated with an increased risk of incident chronic kidney disease (CKD), CKD progression, and end-stage kidney disease [73-76]. However, the mechanism underlying the association between PPI use and risk of CKD is not known, and it is possible that the weak association observed in these studies is due to methodological limitations (residual confounding) [73,74,77]. Further studies are needed to help better define an etiologic relationship between PPI use and the development and worsening of CKD."  Given further studies are needed to better define the CKD risks with PPI, and guidelines are clear on the indication for PPI in the setting of Barrett's esophagus, the answer whether to d/c her PPI is not straightforward. Discontinuing PPI can worsen risks of developing dysplasia or esophageal cancer in the setting of Barrett's. We had an extensive discussion with the pt in regard to the CKD risks with PPI and after  discussion of risks and benefits pt chooses to continue to the medication  Pt is agreeable to nephrology referral at this time as well to obtain their opinion in this regard  She is on a low dose PPI therapy  In addition, another study did not show any significant change in renal function in CKD patient after discontinuation of PPI therapy for 1 yr in some patients. The patient's with barretts had their PPI continues in this study (PMID: [id number])   (  Risks of PPI use were discussed with patient including bone loss, C. Diff diarrhea, pneumonia, infections, CKD, electrolyte abnormalities.  Pt. Verbalizes understanding and chooses to continue the medication.)  Continue antireflux lifestyle measures  Dr [name]

## 2021-08-15 ENCOUNTER — Other Ambulatory Visit: Payer: Self-pay

## 2021-08-15 ENCOUNTER — Encounter: Payer: Self-pay | Admitting: Gastroenterology

## 2021-08-15 ENCOUNTER — Ambulatory Visit (INDEPENDENT_AMBULATORY_CARE_PROVIDER_SITE_OTHER): Payer: Medicare Other | Admitting: Gastroenterology

## 2021-08-15 VITALS — BP 123/78 | HR 78 | Temp 98.1°F | Ht 63.0 in | Wt 180.1 lb

## 2021-08-15 DIAGNOSIS — K2270 Barrett's esophagus without dysplasia: Secondary | ICD-10-CM

## 2021-08-15 DIAGNOSIS — K219 Gastro-esophageal reflux disease without esophagitis: Secondary | ICD-10-CM

## 2021-08-15 DIAGNOSIS — R12 Heartburn: Secondary | ICD-10-CM

## 2021-08-15 NOTE — Progress Notes (Signed)
Cephas Darby, MD [address]  [place]  [place], [place] [address]  Main: [phone]  Fax: [phone]    Gastroenterology Consultation  Referring Provider:     Sofie Hartigan, MD Primary Care Physician:  Sofie Hartigan, MD Primary Gastroenterologist:  Dr. Bonna Gains Reason for Consultation:     Flareup of reflux        HPI:   Debbie Chavez is a 71 y.o. female referred by Dr. Ellison Hughs, Chrissie Noa, MD  for consultation & management of recent exacerbation of heartburn.  Patient reports that she has been experiencing nocturnal heartburn lately.  She reports that she eats around 6 PM and goes to bed 3 to 4 hours later.  She is currently on Protonix 20 mg daily for more than 1 year.  She was previously on Protonix 40 mg once a day prior to the diagnosis of osteoporosis.  She would like to know if she needs further investigation.  NSAIDs: None  Antiplts/Anticoagulants/Anti thrombotics: None  GI Procedures:  Upper endoscopy 09/14/2019 - Esophageal mucosal changes classified as Barrett's stage C2-M3 per Prague criteria. Biopsied. - Small hiatal hernia. - Normal stomach. - Normal examined duodenum.  Past Medical History:  Diagnosis Date   Anemia    Arthritis    hands, knees   Cancer ([place]) 06/2007   right breast    GERD (gastroesophageal reflux disease)    History of hiatal hernia    Hypertension     Past Surgical History:  Procedure Laterality Date   BREAST SURGERY Right 2007   lumpectomy   COLONOSCOPY     COLONOSCOPY WITH PROPOFOL N/A 08/20/2017   Procedure: COLONOSCOPY WITH PROPOFOL;  Surgeon: Virgel Manifold, MD;  Location: [facility];  Service: Endoscopy;  Laterality: N/A;   ESOPHAGOGASTRODUODENOSCOPY (EGD) WITH PROPOFOL N/A 08/20/2017   Procedure: ESOPHAGOGASTRODUODENOSCOPY (EGD) WITH PROPOFOL;  Surgeon: Virgel Manifold, MD;  Location: [facility];  Service: Endoscopy;  Laterality: N/A;   ESOPHAGOGASTRODUODENOSCOPY (EGD) WITH  PROPOFOL N/A 02/12/2018   Procedure: ESOPHAGOGASTRODUODENOSCOPY (EGD) WITH PROPOFOL;  Surgeon: Virgel Manifold, MD;  Location: [facility];  Service: Endoscopy;  Laterality: N/A;   ESOPHAGOGASTRODUODENOSCOPY (EGD) WITH PROPOFOL N/A 09/14/2019   Procedure: ESOPHAGOGASTRODUODENOSCOPY (EGD) WITH BIOPSY;  Surgeon: Lucilla Lame, MD;  Location: [facility];  Service: Endoscopy;  Laterality: N/A;   KNEE ARTHROPLASTY Left 12/14/2019   Procedure: COMPUTER ASSISTED TOTAL KNEE ARTHROPLASTY;  Surgeon: Dereck Leep, MD;  Location: [facility];  Service: Orthopedics;  Laterality: Left;   MASTECTOMY Bilateral 06/2007   no lumph nodes removed   POLYPECTOMY  08/20/2017   Procedure: POLYPECTOMY;  Surgeon: Virgel Manifold, MD;  Location: [facility];  Service: Endoscopy;;   shoulder Left    TUBAL LIGATION     Current Outpatient Medications:    Calcium Carbonate-Vitamin D 500-125 MG-UNIT TABS, Take 1 tablet by mouth daily. , Disp: , Rfl:    celecoxib (CELEBREX) 200 MG capsule, Take 200 mg by mouth daily., Disp: , Rfl:    Cholecalciferol (VITAMIN D3) 25 MCG (1000 UT) CAPS, Take 1,000 Units by mouth daily., Disp: , Rfl:    IRON PO, Take 325 mg by mouth 3 (three) times a week. , Disp: , Rfl:    Multiple Vitamin (MULTIVITAMIN) tablet, Take 2 tablets by mouth daily. Gummie, Disp: , Rfl:    Omega-3 Fatty Acids (FISH OIL) 1200 MG CPDR, Take 1,200 mg by mouth daily. , Disp: , Rfl:    pantoprazole (PROTONIX)  20 MG tablet, , Disp: , Rfl:    traMADol (ULTRAM) 50 MG tablet, Take by mouth., Disp: , Rfl:    valsartan (DIOVAN) 40 MG tablet, Take 40 mg by mouth daily., Disp: , Rfl:    vitamin B-12 (CYANOCOBALAMIN) 1000 MCG tablet, Take 1,000 mg by mouth daily. , Disp: , Rfl:   Family History  Problem Relation Age of Onset   Cancer Mother        breast   Cancer Sister        breast     Social History   Tobacco Use   Smoking status: Never   Smokeless tobacco: Never  Vaping Use   Vaping Use:  Never used  Substance Use Topics   Alcohol use: Yes    Comment: rare - Holidays   Drug use: No    Allergies as of 08/15/2021 - Review Complete 08/15/2021  Allergen Reaction Noted   Lisinopril Hives 09/08/2019   Meperidine Other (See Comments) 07/09/2017    Review of Systems:    All systems reviewed and negative except where noted in HPI.   Physical Exam:  BP 123/78 (BP Location: Left Arm, Patient Position: Sitting, Cuff Size: Normal)    Pulse 78    Temp 98.1 F (36.7 C) (Oral)    Ht 5\' 3"  (1.6 m)    Wt 180 lb 2 oz (81.7 kg)    BMI 31.91 kg/m  No LMP recorded. Patient is postmenopausal.  General:   Alert,  Well-developed, well-nourished, pleasant and cooperative in NAD Head:  Normocephalic and atraumatic. Eyes:  Sclera clear, no icterus.   Conjunctiva pink. Ears:  Normal auditory acuity. Nose:  No deformity, discharge, or lesions. Mouth:  No deformity or lesions,oropharynx pink & moist. Neck:  Supple; no masses or thyromegaly. Lungs:  Respirations even and unlabored.  Clear throughout to auscultation.   No wheezes, crackles, or rhonchi. No acute distress. Heart:  Regular rate and rhythm; no murmurs, clicks, rubs, or gallops. Abdomen:  Normal bowel sounds. Soft, non-tender and non-distended without masses, hepatosplenomegaly or hernias noted.  No guarding or rebound tenderness.   Rectal: Not performed Msk:  Symmetrical without gross deformities. Good, equal movement & strength bilaterally. Pulses:  Normal pulses noted. Extremities:  No clubbing or edema.  No cyanosis. Neurologic:  Alert and oriented x3;  grossly normal neurologically. Skin:  Intact without significant lesions or rashes. No jaundice. Psych:  Alert and cooperative. Normal mood and affect.  Imaging Studies: Reviewed  Assessment and Plan:   Debbie Chavez is a 71 y.o. 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  Continue Protonix 20 mg daily before breakfast Advised patient to take Pepcid 20 mg at bedtime and see if it helps with her nocturnal heartburn.  If there is no relief, will switch to omeprazole 20 mg daily or increase Protonix to 40 mg daily  Do not recommend upper endoscopy at this time unless her symptoms are not well controlled on acid suppressive therapy.  Otherwise, recommend surveillance upper endoscopy in 08/2021 for history of Barrett's esophagus   Follow up in 12 months or as needed   Cephas Darby, MD

## 2021-11-22 ENCOUNTER — Telehealth: Payer: Self-pay | Admitting: Gastroenterology

## 2021-11-22 MED ORDER — PANTOPRAZOLE SODIUM 20 MG PO TBEC: 20.0000 mg | DELAYED_RELEASE_TABLET | Freq: Every day | ORAL | 2 refills | Status: DC

## 2021-11-22 NOTE — Telephone Encounter (Signed)
Patient is requesting pantoprazole '20mg'$ . Sent medication to mail order pharmacy

## 2021-11-22 NOTE — Telephone Encounter (Signed)
Patient states she needs a refill on a prescription and the pharmacy will not refill the prescription because she no longer see's [facility]. She see's dr Marius Ditch. Requesting call back.

## 2021-11-22 NOTE — Addendum Note (Signed)
Addended by: Ulyess Blossom L on: 11/22/2021 02:09 PM   Modules accepted: Orders

## 2021-12-27 ENCOUNTER — Encounter: Payer: Self-pay | Admitting: Ophthalmology

## 2022-01-05 NOTE — Discharge Instructions (Signed)

## 2022-01-08 ENCOUNTER — Encounter: Payer: Self-pay | Admitting: Ophthalmology

## 2022-01-08 ENCOUNTER — Ambulatory Visit: Payer: Medicare Other | Admitting: Anesthesiology

## 2022-01-08 ENCOUNTER — Other Ambulatory Visit: Payer: Self-pay

## 2022-01-08 ENCOUNTER — Ambulatory Visit
Admission: RE | Admit: 2022-01-08 | Discharge: 2022-01-08 | Disposition: A | Discharge status: Home / Self Care | Payer: Medicare Other | Attending: Ophthalmology | Admitting: Ophthalmology

## 2022-01-08 ENCOUNTER — Encounter
Admission: RE | Disposition: A | Discharge status: Home / Self Care | Payer: Self-pay | Source: Home / Self Care | Attending: Ophthalmology

## 2022-01-08 DIAGNOSIS — K219 Gastro-esophageal reflux disease without esophagitis: Secondary | ICD-10-CM | POA: Diagnosis not present

## 2022-01-08 DIAGNOSIS — D649 Anemia, unspecified: Secondary | ICD-10-CM | POA: Diagnosis not present

## 2022-01-08 DIAGNOSIS — Z79899 Other long term (current) drug therapy: Secondary | ICD-10-CM | POA: Insufficient documentation

## 2022-01-08 DIAGNOSIS — I10 Essential (primary) hypertension: Secondary | ICD-10-CM | POA: Insufficient documentation

## 2022-01-08 DIAGNOSIS — M1990 Unspecified osteoarthritis, unspecified site: Secondary | ICD-10-CM | POA: Diagnosis not present

## 2022-01-08 DIAGNOSIS — H2512 Age-related nuclear cataract, left eye: Secondary | ICD-10-CM | POA: Diagnosis not present

## 2022-01-08 HISTORY — PX: CATARACT EXTRACTION W/PHACO: SHX586

## 2022-01-08 SURGERY — PHACOEMULSIFICATION, CATARACT, WITH IOL INSERTION
Anesthesia: Monitor Anesthesia Care | Site: Eye | Laterality: Left

## 2022-01-08 MED ORDER — MIDAZOLAM HCL 2 MG/2ML IJ SOLN
INTRAMUSCULAR | Status: DC | PRN
  Administered 2022-01-08: 2 mg via INTRAVENOUS

## 2022-01-08 MED ORDER — SIGHTPATH DOSE#1 BSS IO SOLN
INTRAOCULAR | Status: DC | PRN
  Administered 2022-01-08: 15 mL

## 2022-01-08 MED ORDER — SIGHTPATH DOSE#1 NA HYALUR & NA CHOND-NA HYALUR IO KIT
PACK | INTRAOCULAR | Status: DC | PRN
  Administered 2022-01-08: 1 via OPHTHALMIC

## 2022-01-08 MED ORDER — FENTANYL CITRATE (PF) 100 MCG/2ML IJ SOLN
INTRAMUSCULAR | Status: DC | PRN
  Administered 2022-01-08: 100 ug via INTRAVENOUS

## 2022-01-08 MED ORDER — SIGHTPATH DOSE#1 BSS IO SOLN
INTRAOCULAR | Status: DC | PRN
  Administered 2022-01-08: 65 mL via OPHTHALMIC

## 2022-01-08 MED ORDER — LIDOCAINE HCL (PF) 2 % IJ SOLN
INTRAOCULAR | Status: DC | PRN
  Administered 2022-01-08: 1 mL via INTRAOCULAR

## 2022-01-08 MED ORDER — ONDANSETRON HCL 4 MG/2ML IJ SOLN
INTRAMUSCULAR | Status: DC | PRN
  Administered 2022-01-08: 4 mg via INTRAVENOUS

## 2022-01-08 MED ORDER — TETRACAINE HCL 0.5 % OP SOLN
1.0000 [drp] | OPHTHALMIC | Status: DC | PRN
  Administered 2022-01-08 (×3): 1 [drp] via OPHTHALMIC

## 2022-01-08 MED ORDER — ARMC OPHTHALMIC DILATING DROPS
1.0000 | OPHTHALMIC | Status: DC | PRN
  Administered 2022-01-08 (×3): 1 via OPHTHALMIC

## 2022-01-08 MED ORDER — MOXIFLOXACIN HCL 0.5 % OP SOLN
OPHTHALMIC | Status: DC | PRN
  Administered 2022-01-08: 0.2 mL via OPHTHALMIC

## 2022-01-08 SURGICAL SUPPLY — 14 items
CATARACT SUITE SIGHTPATH (MISCELLANEOUS) ×2 IMPLANT
DISSECTOR HYDRO NUCLEUS 50X22 (MISCELLANEOUS) ×2 IMPLANT
FEE CATARACT SUITE SIGHTPATH (MISCELLANEOUS) ×1 IMPLANT
GLOVE SURG GAMMEX PI TX LF 7.5 (GLOVE) ×2 IMPLANT
GLOVE SURG SYN 8.5  E (GLOVE) ×1
GLOVE SURG SYN 8.5 E (GLOVE) ×1 IMPLANT
GLOVE SURG SYN 8.5 PF PI (GLOVE) ×1 IMPLANT
LENS IOL TECNIS EYHANCE 23.5 (Intraocular Lens) ×1 IMPLANT
NDL FILTER BLUNT 18X1 1/2 (NEEDLE) ×1 IMPLANT
NEEDLE FILTER BLUNT 18X 1/2SAF (NEEDLE) ×1
NEEDLE FILTER BLUNT 18X1 1/2 (NEEDLE) ×1 IMPLANT
SYR 3ML LL SCALE MARK (SYRINGE) ×2 IMPLANT
SYR 5ML LL (SYRINGE) ×2 IMPLANT
WATER STERILE IRR 250ML POUR (IV SOLUTION) ×2 IMPLANT

## 2022-01-08 NOTE — Anesthesia Preprocedure Evaluation (Signed)
Anesthesia Evaluation  Patient identified by MRN, date of birth, ID band Patient awake    Reviewed: Allergy & Precautions, H&P , NPO status , Patient's Chart, lab work & pertinent test results  History of Anesthesia Complications Negative for: history of anesthetic complications  Airway Mallampati: II  TM Distance: >3 FB Neck ROM: full    Dental no notable dental hx.    Pulmonary neg pulmonary ROS,    Pulmonary exam normal breath sounds clear to auscultation       Cardiovascular hypertension, On Medications Normal cardiovascular exam     Neuro/Psych negative neurological ROS  negative psych ROS   GI/Hepatic Neg liver ROS, hiatal hernia, GERD  Medicated,  Endo/Other  negative endocrine ROS  Renal/GU negative Renal ROS  negative genitourinary   Musculoskeletal  (+) Arthritis ,   Abdominal (+) + obese (BMI 32),   Peds  Hematology  (+) Blood dyscrasia, anemia ,   Anesthesia Other Findings   Reproductive/Obstetrics                             Anesthesia Physical  Anesthesia Plan  ASA: 2  Anesthesia Plan: MAC   Post-op Pain Management: Minimal or no pain anticipated   Induction:   PONV Risk Score and Plan: 2 and TIVA, Midazolam and Treatment may vary due to age or medical condition  Airway Management Planned: Nasal Cannula and Natural Airway  Additional Equipment:   Intra-op Plan:   Post-operative Plan:   Informed Consent: I have reviewed the patients History and Physical, chart, labs and discussed the procedure including the risks, benefits and alternatives for the proposed anesthesia with the patient or authorized representative who has indicated his/her understanding and acceptance.     Dental advisory given  Plan Discussed with: CRNA  Anesthesia Plan Comments:         Anesthesia Quick Evaluation

## 2022-01-08 NOTE — Op Note (Signed)
OPERATIVE NOTE  Debbie Chavez [id number] 01/08/2022   PREOPERATIVE DIAGNOSIS:  Nuclear sclerotic cataract left eye.  H25.12   POSTOPERATIVE DIAGNOSIS:    Nuclear sclerotic cataract left eye.     PROCEDURE:  Phacoemusification with posterior chamber intraocular lens placement of the left eye   LENS:   Implant Name Type Inv. Item Serial No. Manufacturer Lot No. LRB No. Used Action  LENS IOL TECNIS EYHANCE 23.5 - [id number] Intraocular Lens LENS IOL TECNIS EYHANCE 23.5 [id number] [employer]  Left 1 Implanted      Procedure(s) with comments: CATARACT EXTRACTION PHACO AND INTRAOCULAR LENS PLACEMENT (IOC) LEFT (Left) - 3.33 00:25.8  DIB00 +23.5   ULTRASOUND TIME: 0 minutes 25 seconds.  CDE 3.33   SURGEON:  Benay Pillow, MD, MPH   ANESTHESIA:  Topical with tetracaine drops augmented with 1% preservative-free intracameral lidocaine.  ESTIMATED BLOOD LOSS: <1 mL   COMPLICATIONS:  None.   DESCRIPTION OF PROCEDURE:  The patient was identified in the holding room and transported to the operating room and placed in the supine position under the operating microscope.  The left eye was identified as the operative eye and it was prepped and draped in the usual sterile ophthalmic fashion.   A 1.0 millimeter clear-corneal paracentesis was made at the 5:00 position. 0.5 ml of preservative-free 1% lidocaine with epinephrine was injected into the anterior chamber.  The anterior chamber was filled with viscoelastic.  A 2.4 millimeter keratome was used to make a near-clear corneal incision at the 2:00 position.  A curvilinear capsulorrhexis was made with a cystotome and capsulorrhexis forceps.  Balanced salt solution was used to hydrodissect and hydrodelineate the nucleus.   Phacoemulsification was then used in stop and chop fashion to remove the lens nucleus and epinucleus.  The remaining cortex was then removed using the irrigation and aspiration handpiece. Viscoelastic was then placed into the  capsular bag to distend it for lens placement.  A lens was then injected into the capsular bag.  The remaining viscoelastic was aspirated.   Wounds were hydrated with balanced salt solution.  The anterior chamber was inflated to a physiologic pressure with balanced salt solution.  Intracameral vigamox 0.1 mL undiltued was injected into the eye and a drop placed onto the ocular surface.  No wound leaks were noted.  The patient was taken to the recovery room in stable condition without complications of anesthesia or surgery  Benay Pillow 01/08/2022, 1:04 PM

## 2022-01-08 NOTE — H&P (Signed)
Debbie Chavez   Primary Care Physician:  Sofie Hartigan, MD Ophthalmologist: Dr. Benay Pillow  Pre-Procedure History & Physical: HPI:  Debbie Chavez is a 71 y.o. female here for cataract surgery.   Past Medical History:  Diagnosis Date   Anemia    Arthritis    hands, knees   Cancer ([place]) 06/2007   right breast    GERD (gastroesophageal reflux disease)    History of hiatal hernia    Hypertension     Past Surgical History:  Procedure Laterality Date   BREAST SURGERY Right 2007   lumpectomy   COLONOSCOPY     COLONOSCOPY WITH PROPOFOL N/A 08/20/2017   Procedure: COLONOSCOPY WITH PROPOFOL;  Surgeon: Virgel Manifold, MD;  Location: [facility];  Service: Endoscopy;  Laterality: N/A;   ESOPHAGOGASTRODUODENOSCOPY (EGD) WITH PROPOFOL N/A 08/20/2017   Procedure: ESOPHAGOGASTRODUODENOSCOPY (EGD) WITH PROPOFOL;  Surgeon: Virgel Manifold, MD;  Location: [facility];  Service: Endoscopy;  Laterality: N/A;   ESOPHAGOGASTRODUODENOSCOPY (EGD) WITH PROPOFOL N/A 02/12/2018   Procedure: ESOPHAGOGASTRODUODENOSCOPY (EGD) WITH PROPOFOL;  Surgeon: Virgel Manifold, MD;  Location: [facility];  Service: Endoscopy;  Laterality: N/A;   ESOPHAGOGASTRODUODENOSCOPY (EGD) WITH PROPOFOL N/A 09/14/2019   Procedure: ESOPHAGOGASTRODUODENOSCOPY (EGD) WITH BIOPSY;  Surgeon: Lucilla Lame, MD;  Location: [facility];  Service: Endoscopy;  Laterality: N/A;   KNEE ARTHROPLASTY Left 12/14/2019   Procedure: COMPUTER ASSISTED TOTAL KNEE ARTHROPLASTY;  Surgeon: Dereck Leep, MD;  Location: [facility];  Service: Orthopedics;  Laterality: Left;   MASTECTOMY Bilateral 06/2007   no lumph nodes removed   POLYPECTOMY  08/20/2017   Procedure: POLYPECTOMY;  Surgeon: Virgel Manifold, MD;  Location: [facility];  Service: Endoscopy;;   shoulder Left    TUBAL LIGATION     VAGINAL HYSTERECTOMY      Prior to Admission medications   Medication Sig Start Date End  Date Taking? Authorizing Provider  Calcium Carbonate-Vitamin D 500-125 MG-UNIT TABS Take 1 tablet by mouth daily.    Yes [provider]  celecoxib (CELEBREX) 200 MG capsule Take 200 mg by mouth daily. 02/24/21  Yes [provider]  Cholecalciferol (VITAMIN D3) 25 MCG (1000 UT) CAPS Take 1,000 Units by mouth daily.   Yes [provider]  IRON PO Take 325 mg by mouth 3 (three) times a week.    Yes [provider]  Multiple Vitamin (MULTIVITAMIN) tablet Take 2 tablets by mouth daily. Gummie   Yes [provider]  Omega-3 Fatty Acids (FISH OIL) 1200 MG CPDR Take 1,200 mg by mouth daily.    Yes [provider]  pantoprazole (PROTONIX) 20 MG tablet Take 1 tablet (20 mg total) by mouth daily before breakfast. 11/22/21  Yes Vanga, Tally Due, MD  traMADol (ULTRAM) 50 MG tablet Take by mouth. 08/26/20  Yes [provider]  valsartan (DIOVAN) 40 MG tablet Take 40 mg by mouth daily.   Yes [provider]  vitamin B-12 (CYANOCOBALAMIN) 1000 MCG tablet Take 1,000 mg by mouth daily.    Yes [provider]    Allergies as of 12/21/2021 - Review Complete 08/15/2021  Allergen Reaction Noted   Lisinopril Hives 09/08/2019   Meperidine Other (See Comments) 07/09/2017    Family History  Problem Relation Age of Onset   Cancer Mother        breast   Cancer Sister        breast    Social History   Socioeconomic History  Marital status: Married    Spouse name: Not on file   Number of children: Not on file   Years of education: Not on file   Highest education level: Not on file  Occupational History   Not on file  Tobacco Use   Smoking status: Never   Smokeless tobacco: Never  Vaping Use   Vaping Use: Never used  Substance and Sexual Activity   Alcohol use: Yes    Comment: rare - Holidays   Drug use: No   Sexual activity: Not on file  Other Topics Concern   Not on file  Social History Narrative   Not on file    Social Determinants of Health   Financial Resource Strain: Not on file  Food Insecurity: Not on file  Transportation Needs: Not on file  Physical Activity: Not on file  Stress: Not on file  Social Connections: Not on file  Intimate Partner Violence: Not on file    Review of Systems: See HPI, otherwise negative ROS  Physical Exam: BP 132/62   Pulse 70   Temp (!) 97.5 F (36.4 C) (Temporal)   Resp 20   Ht '5\' 3"'$  (1.6 m)   Wt 82.1 kg   SpO2 98%   BMI 32.04 kg/m  General:   Alert, cooperative in NAD Head:  Normocephalic and atraumatic. Respiratory:  Normal work of breathing. Cardiovascular:  RRR  Impression/Plan: Debbie Chavez is here for cataract surgery.  Risks, benefits, limitations, and alternatives regarding cataract surgery have been reviewed with the patient.  Questions have been answered.  All parties agreeable.   Benay Pillow, MD  01/08/2022, 12:38 PM

## 2022-01-08 NOTE — Transfer of Care (Signed)
Immediate Anesthesia Transfer of Care Note  Patient: Debbie Chavez  Procedure(s) Performed: CATARACT EXTRACTION PHACO AND INTRAOCULAR LENS PLACEMENT (IOC) LEFT (Left: Eye)  Patient Location: PACU  Anesthesia Type: MAC  Level of Consciousness: awake, alert  and patient cooperative  Airway and Oxygen Therapy: Patient Spontanous Breathing and Patient connected to supplemental oxygen  Post-op Assessment: Post-op Vital signs reviewed, Patient's Cardiovascular Status Stable, Respiratory Function Stable, Patent Airway and No signs of Nausea or vomiting  Post-op Vital Signs: Reviewed and stable  Complications: No notable events documented.

## 2022-01-08 NOTE — Anesthesia Postprocedure Evaluation (Signed)
Anesthesia Post Note  Patient: Debbie Chavez  Procedure(s) Performed: CATARACT EXTRACTION PHACO AND INTRAOCULAR LENS PLACEMENT (IOC) LEFT (Left: Eye)     Patient location during evaluation: PACU Anesthesia Type: MAC Level of consciousness: awake and alert Pain management: pain level controlled Vital Signs Assessment: post-procedure vital signs reviewed and stable Respiratory status: spontaneous breathing and nonlabored ventilation Cardiovascular status: blood pressure returned to baseline Postop Assessment: no apparent nausea or vomiting Anesthetic complications: no   No notable events documented.  Fuad Forget [employer]

## 2022-01-09 ENCOUNTER — Other Ambulatory Visit: Payer: Self-pay

## 2022-01-09 ENCOUNTER — Encounter: Payer: Self-pay | Admitting: Ophthalmology

## 2022-01-18 NOTE — Discharge Instructions (Signed)

## 2022-01-21 NOTE — Anesthesia Preprocedure Evaluation (Signed)
Anesthesia Evaluation  Patient identified by MRN, date of birth, ID band Patient awake    Reviewed: Allergy & Precautions, H&P , NPO status , Patient's Chart, lab work & pertinent test results  History of Anesthesia Complications Negative for: history of anesthetic complications  Airway Mallampati: II  TM Distance: >3 FB Neck ROM: full    Dental no notable dental hx.    Pulmonary neg pulmonary ROS,    Pulmonary exam normal breath sounds clear to auscultation       Cardiovascular hypertension, On Medications Normal cardiovascular exam     Neuro/Psych negative neurological ROS  negative psych ROS   GI/Hepatic Neg liver ROS, hiatal hernia, GERD  Medicated,  Endo/Other  negative endocrine ROS  Renal/GU negative Renal ROS  negative genitourinary   Musculoskeletal  (+) Arthritis ,   Abdominal (+) + obese (BMI 32),   Peds  Hematology  (+) Blood dyscrasia, anemia ,   Anesthesia Other Findings   Reproductive/Obstetrics                             Anesthesia Physical  Anesthesia Plan  ASA: 2  Anesthesia Plan: MAC   Post-op Pain Management: Minimal or no pain anticipated   Induction: Intravenous  PONV Risk Score and Plan: 2 and TIVA, Midazolam and Treatment may vary due to age or medical condition  Airway Management Planned: Nasal Cannula and Natural Airway  Additional Equipment:   Intra-op Plan:   Post-operative Plan:   Informed Consent: I have reviewed the patients History and Physical, chart, labs and discussed the procedure including the risks, benefits and alternatives for the proposed anesthesia with the patient or authorized representative who has indicated his/her understanding and acceptance.     Dental advisory given  Plan Discussed with: CRNA  Anesthesia Plan Comments:        Anesthesia Quick Evaluation

## 2022-01-22 ENCOUNTER — Encounter
Admission: RE | Disposition: A | Discharge status: Home / Self Care | Payer: Self-pay | Source: Home / Self Care | Attending: Ophthalmology

## 2022-01-22 ENCOUNTER — Ambulatory Visit
Admission: RE | Admit: 2022-01-22 | Discharge: 2022-01-22 | Disposition: A | Discharge status: Home / Self Care | Payer: Medicare Other | Attending: Ophthalmology | Admitting: Ophthalmology

## 2022-01-22 ENCOUNTER — Encounter: Payer: Self-pay | Admitting: Ophthalmology

## 2022-01-22 ENCOUNTER — Ambulatory Visit: Payer: Medicare Other | Admitting: Anesthesiology

## 2022-01-22 ENCOUNTER — Other Ambulatory Visit: Payer: Self-pay

## 2022-01-22 ENCOUNTER — Ambulatory Visit (AMBULATORY_SURGERY_CENTER): Payer: Medicare Other | Admitting: Anesthesiology

## 2022-01-22 DIAGNOSIS — Z79899 Other long term (current) drug therapy: Secondary | ICD-10-CM | POA: Diagnosis not present

## 2022-01-22 DIAGNOSIS — I10 Essential (primary) hypertension: Secondary | ICD-10-CM | POA: Diagnosis not present

## 2022-01-22 DIAGNOSIS — M1990 Unspecified osteoarthritis, unspecified site: Secondary | ICD-10-CM

## 2022-01-22 DIAGNOSIS — H2511 Age-related nuclear cataract, right eye: Secondary | ICD-10-CM

## 2022-01-22 DIAGNOSIS — K219 Gastro-esophageal reflux disease without esophagitis: Secondary | ICD-10-CM | POA: Insufficient documentation

## 2022-01-22 DIAGNOSIS — D649 Anemia, unspecified: Secondary | ICD-10-CM | POA: Insufficient documentation

## 2022-01-22 HISTORY — PX: CATARACT EXTRACTION W/PHACO: SHX586

## 2022-01-22 SURGERY — PHACOEMULSIFICATION, CATARACT, WITH IOL INSERTION
Anesthesia: Monitor Anesthesia Care | Site: Eye | Laterality: Right

## 2022-01-22 MED ORDER — SIGHTPATH DOSE#1 BSS IO SOLN
INTRAOCULAR | Status: DC | PRN
  Administered 2022-01-22: 15 mL

## 2022-01-22 MED ORDER — ARMC OPHTHALMIC DILATING DROPS
1.0000 | OPHTHALMIC | Status: DC | PRN
  Administered 2022-01-22 (×3): 1 via OPHTHALMIC

## 2022-01-22 MED ORDER — LIDOCAINE HCL (PF) 2 % IJ SOLN
INTRAOCULAR | Status: DC | PRN
  Administered 2022-01-22: 1 mL via INTRAOCULAR

## 2022-01-22 MED ORDER — SIGHTPATH DOSE#1 NA HYALUR & NA CHOND-NA HYALUR IO KIT
PACK | INTRAOCULAR | Status: DC | PRN
  Administered 2022-01-22: 1 via OPHTHALMIC

## 2022-01-22 MED ORDER — FENTANYL CITRATE (PF) 100 MCG/2ML IJ SOLN
INTRAMUSCULAR | Status: DC | PRN
  Administered 2022-01-22: 50 ug via INTRAVENOUS

## 2022-01-22 MED ORDER — MIDAZOLAM HCL 2 MG/2ML IJ SOLN
INTRAMUSCULAR | Status: DC | PRN
  Administered 2022-01-22: 1 mg via INTRAVENOUS

## 2022-01-22 MED ORDER — ONDANSETRON HCL 4 MG/2ML IJ SOLN
INTRAMUSCULAR | Status: DC | PRN
  Administered 2022-01-22: 4 mg via INTRAVENOUS

## 2022-01-22 MED ORDER — SIGHTPATH DOSE#1 BSS IO SOLN
INTRAOCULAR | Status: DC | PRN
  Administered 2022-01-22: 69 mL via OPHTHALMIC

## 2022-01-22 MED ORDER — TETRACAINE HCL 0.5 % OP SOLN
1.0000 [drp] | OPHTHALMIC | Status: DC | PRN
  Administered 2022-01-22 (×3): 1 [drp] via OPHTHALMIC

## 2022-01-22 MED ORDER — MOXIFLOXACIN HCL 0.5 % OP SOLN
OPHTHALMIC | Status: DC | PRN
  Administered 2022-01-22: 0.2 mL via OPHTHALMIC

## 2022-01-22 SURGICAL SUPPLY — 14 items
CATARACT SUITE SIGHTPATH (MISCELLANEOUS) ×2 IMPLANT
DISSECTOR HYDRO NUCLEUS 50X22 (MISCELLANEOUS) ×2 IMPLANT
FEE CATARACT SUITE SIGHTPATH (MISCELLANEOUS) ×1 IMPLANT
GLOVE SURG GAMMEX PI TX LF 7.5 (GLOVE) ×2 IMPLANT
GLOVE SURG SYN 8.5  E (GLOVE) ×1
GLOVE SURG SYN 8.5 E (GLOVE) ×1 IMPLANT
GLOVE SURG SYN 8.5 PF PI (GLOVE) ×1 IMPLANT
LENS IOL TECNIS EYHANCE 23.5 (Intraocular Lens) ×1 IMPLANT
NDL FILTER BLUNT 18X1 1/2 (NEEDLE) ×1 IMPLANT
NEEDLE FILTER BLUNT 18X 1/2SAF (NEEDLE) ×1
NEEDLE FILTER BLUNT 18X1 1/2 (NEEDLE) ×1 IMPLANT
SYR 3ML LL SCALE MARK (SYRINGE) ×2 IMPLANT
SYR 5ML LL (SYRINGE) ×2 IMPLANT
WATER STERILE IRR 250ML POUR (IV SOLUTION) ×2 IMPLANT

## 2022-01-22 NOTE — Anesthesia Postprocedure Evaluation (Signed)
Anesthesia Post Note  Patient: Debbie Chavez  Procedure(s) Performed: CATARACT EXTRACTION PHACO AND INTRAOCULAR LENS PLACEMENT (IOC) RIGHT (Right: Eye)     Patient location during evaluation: PACU Anesthesia Type: MAC Level of consciousness: awake and alert Pain management: pain level controlled Vital Signs Assessment: post-procedure vital signs reviewed and stable Respiratory status: spontaneous breathing, nonlabored ventilation and respiratory function stable Cardiovascular status: blood pressure returned to baseline and stable Postop Assessment: no apparent nausea or vomiting Anesthetic complications: no   No notable events documented.  [place] Ouch

## 2022-01-22 NOTE — H&P (Signed)
[facility]   Primary Care Physician:  Sofie Hartigan, MD Ophthalmologist: Dr. Benay Pillow  Pre-Procedure History & Physical: HPI:  Debbie Chavez is a 71 y.o. female here for cataract surgery.   Past Medical History:  Diagnosis Date   Anemia    Arthritis    hands, knees   Cancer ([place]) 06/2007   right breast    GERD (gastroesophageal reflux disease)    History of hiatal hernia    Hypertension     Past Surgical History:  Procedure Laterality Date   BREAST SURGERY Right 2007   lumpectomy   CATARACT EXTRACTION W/PHACO Left 01/08/2022   Procedure: CATARACT EXTRACTION PHACO AND INTRAOCULAR LENS PLACEMENT ([place]) LEFT;  Surgeon: Eulogio Bear, MD;  Location: [place];  Service: Ophthalmology;  Laterality: Left;  3.33 00:25.8   COLONOSCOPY     COLONOSCOPY WITH PROPOFOL N/A 08/20/2017   Procedure: COLONOSCOPY WITH PROPOFOL;  Surgeon: Virgel Manifold, MD;  Location: [place];  Service: Endoscopy;  Laterality: N/A;   ESOPHAGOGASTRODUODENOSCOPY (EGD) WITH PROPOFOL N/A 08/20/2017   Procedure: ESOPHAGOGASTRODUODENOSCOPY (EGD) WITH PROPOFOL;  Surgeon: Virgel Manifold, MD;  Location: [place];  Service: Endoscopy;  Laterality: N/A;   ESOPHAGOGASTRODUODENOSCOPY (EGD) WITH PROPOFOL N/A 02/12/2018   Procedure: ESOPHAGOGASTRODUODENOSCOPY (EGD) WITH PROPOFOL;  Surgeon: Virgel Manifold, MD;  Location: [facility];  Service: Endoscopy;  Laterality: N/A;   ESOPHAGOGASTRODUODENOSCOPY (EGD) WITH PROPOFOL N/A 09/14/2019   Procedure: ESOPHAGOGASTRODUODENOSCOPY (EGD) WITH BIOPSY;  Surgeon: Lucilla Lame, MD;  Location: [place];  Service: Endoscopy;  Laterality: N/A;   KNEE ARTHROPLASTY Left 12/14/2019   Procedure: COMPUTER ASSISTED TOTAL KNEE ARTHROPLASTY;  Surgeon: Dereck Leep, MD;  Location: [facility];  Service: Orthopedics;  Laterality: Left;   MASTECTOMY Bilateral 06/2007   no lumph nodes removed   POLYPECTOMY  08/20/2017    Procedure: POLYPECTOMY;  Surgeon: Virgel Manifold, MD;  Location: [place];  Service: Endoscopy;;   shoulder Left    TUBAL LIGATION     VAGINAL HYSTERECTOMY      Prior to Admission medications   Medication Sig Start Date End Date Taking? Authorizing Provider  Calcium Carbonate-Vitamin D 500-125 MG-UNIT TABS Take 1 tablet by mouth daily.    Yes [provider]  celecoxib (CELEBREX) 200 MG capsule Take 200 mg by mouth daily. 02/24/21  Yes [provider]  Cholecalciferol (VITAMIN D3) 25 MCG (1000 UT) CAPS Take 1,000 Units by mouth daily.   Yes [provider]  IRON PO Take 325 mg by mouth 3 (three) times a week.    Yes [provider]  Multiple Vitamin (MULTIVITAMIN) tablet Take 2 tablets by mouth daily. Gummie   Yes [provider]  Omega-3 Fatty Acids (FISH OIL) 1200 MG CPDR Take 1,200 mg by mouth daily.    Yes [provider]  pantoprazole (PROTONIX) 20 MG tablet Take 1 tablet (20 mg total) by mouth daily before breakfast. 11/22/21  Yes Vanga, Tally Due, MD  traMADol (ULTRAM) 50 MG tablet Take by mouth. 08/26/20  Yes [provider]  valsartan (DIOVAN) 40 MG tablet Take 40 mg by mouth daily.   Yes [provider]  vitamin B-12 (CYANOCOBALAMIN) 1000 MCG tablet Take 1,000 mg by mouth daily.    Yes [provider]    Allergies as of 12/21/2021 - Review Complete 08/15/2021  Allergen Reaction Noted   Lisinopril Hives 09/08/2019   Meperidine Other (See Comments) 07/09/2017    Family History  Problem Relation Age of Onset   Cancer Mother        breast   Cancer Sister        breast    Social History   Socioeconomic History   Marital status: Married    Spouse name: Not on file   Number of children: Not on file   Years of education: Not on file   Highest education level: Not on file  Occupational History   Not on file  Tobacco Use   Smoking status: Never   Smokeless tobacco: Never   Vaping Use   Vaping Use: Never used  Substance and Sexual Activity   Alcohol use: Yes    Comment: rare - Holidays   Drug use: No   Sexual activity: Not on file  Other Topics Concern   Not on file  Social History Narrative   Not on file   Social Determinants of Health   Financial Resource Strain: Not on file  Food Insecurity: Not on file  Transportation Needs: Not on file  Physical Activity: Not on file  Stress: Not on file  Social Connections: Not on file  Intimate Partner Violence: Not on file    Review of Systems: See HPI, otherwise negative ROS  Physical Exam: BP 113/63   Pulse 63   Temp (!) 97.4 F (36.3 C) (Temporal)   Ht '5\' 3"'$  (1.6 m)   Wt 81.9 kg   SpO2 100%   BMI 31.97 kg/m  General:   Alert, cooperative in NAD Head:  Normocephalic and atraumatic. Respiratory:  Normal work of breathing. Cardiovascular:  RRR  Impression/Plan: Debbie Chavez is here for cataract surgery.  Risks, benefits, limitations, and alternatives regarding cataract surgery have been reviewed with the patient.  Questions have been answered.  All parties agreeable.   Benay Pillow, MD  01/22/2022, 11:05 AM

## 2022-01-22 NOTE — Transfer of Care (Signed)
Immediate Anesthesia Transfer of Care Note  Patient: Debbie Chavez  Procedure(s) Performed: CATARACT EXTRACTION PHACO AND INTRAOCULAR LENS PLACEMENT ([facility]) RIGHT (Right: Eye)  Patient Location: PACU  Anesthesia Type: MAC  Level of Consciousness: awake, alert  and patient cooperative  Airway and Oxygen Therapy: Patient Spontanous Breathing and Patient connected to supplemental oxygen  Post-op Assessment: Post-op Vital signs reviewed, Patient's Cardiovascular Status Stable, Respiratory Function Stable, Patent Airway and No signs of Nausea or vomiting  Post-op Vital Signs: Reviewed and stable  Complications: No notable events documented.

## 2022-01-22 NOTE — Op Note (Signed)
OPERATIVE NOTE  Debbie Chavez [id number] 01/22/2022   PREOPERATIVE DIAGNOSIS:  Nuclear sclerotic cataract right eye.  H25.11   POSTOPERATIVE DIAGNOSIS:    Nuclear sclerotic cataract right eye.     PROCEDURE:  Phacoemusification with posterior chamber intraocular lens placement of the right eye   LENS:   Implant Name Type Inv. Item Serial No. Manufacturer Lot No. LRB No. Used Action  LENS IOL TECNIS EYHANCE 23.5 - [id number] Intraocular Lens LENS IOL TECNIS EYHANCE 23.5 [id number] [employer]  Right 1 Implanted       Procedure(s) with comments: CATARACT EXTRACTION PHACO AND INTRAOCULAR LENS PLACEMENT ([facility]) RIGHT (Right) - 3.58 00:26.9  DIB00 +23.5   ULTRASOUND TIME: 0 minutes 26 seconds.  CDE 3.58   SURGEON:  Benay Pillow, MD, MPH  ANESTHESIOLOGIST: Anesthesiologist: Iran Ouch, MD CRNA: Tollie Eth, CRNA; Lerry Liner, CRNA   ANESTHESIA:  Topical with tetracaine drops augmented with 1% preservative-free intracameral lidocaine.  ESTIMATED BLOOD LOSS: less than 1 mL.   COMPLICATIONS:  None.   DESCRIPTION OF PROCEDURE:  The patient was identified in the holding room and transported to the operating room and placed in the supine position under the operating microscope.  The right eye was identified as the operative eye and it was prepped and draped in the usual sterile ophthalmic fashion.   A 1.0 millimeter clear-corneal paracentesis was made at the 10:30 position. 0.5 ml of preservative-free 1% lidocaine with epinephrine was injected into the anterior chamber.  The anterior chamber was filled with viscoelastic.  A 2.4 millimeter keratome was used to make a near-clear corneal incision at the 8:00 position.  A curvilinear capsulorrhexis was made with a cystotome and capsulorrhexis forceps.  Balanced salt solution was used to hydrodissect and hydrodelineate the nucleus.   Phacoemulsification was then used in stop and chop fashion to remove the lens nucleus  and epinucleus.  The remaining cortex was then removed using the irrigation and aspiration handpiece. Viscoelastic was then placed into the capsular bag to distend it for lens placement.  A lens was then injected into the capsular bag.  The remaining viscoelastic was aspirated.   Wounds were hydrated with balanced salt solution.  The anterior chamber was inflated to a physiologic pressure with balanced salt solution.   Intracameral vigamox 0.1 mL undiluted was injected into the eye and a drop placed onto the ocular surface.  No wound leaks were noted.  The patient was taken to the recovery room in stable condition without complications of anesthesia or surgery  Benay Pillow 01/22/2022, 11:35 AM

## 2022-05-16 ENCOUNTER — Other Ambulatory Visit: Payer: Self-pay

## 2022-05-21 ENCOUNTER — Encounter: Payer: Self-pay | Admitting: Gastroenterology

## 2022-05-21 ENCOUNTER — Ambulatory Visit (INDEPENDENT_AMBULATORY_CARE_PROVIDER_SITE_OTHER): Payer: Medicare Other | Admitting: Gastroenterology

## 2022-05-21 VITALS — BP 112/65 | HR 77 | Temp 98.9°F | Ht 63.0 in | Wt 176.0 lb

## 2022-05-21 DIAGNOSIS — K219 Gastro-esophageal reflux disease without esophagitis: Secondary | ICD-10-CM

## 2022-05-21 DIAGNOSIS — Z86010 Personal history of colonic polyps: Secondary | ICD-10-CM | POA: Diagnosis not present

## 2022-05-21 DIAGNOSIS — K2270 Barrett's esophagus without dysplasia: Secondary | ICD-10-CM

## 2022-05-21 MED ORDER — PANTOPRAZOLE SODIUM 20 MG PO TBEC: 20.0000 mg | DELAYED_RELEASE_TABLET | Freq: Every day | ORAL | 4 refills | Status: DC

## 2022-05-21 NOTE — Progress Notes (Signed)
Cephas Darby, MD [address]  [place]  [place], [place] [address]  Main: [phone]  Fax: [phone]    Gastroenterology Consultation  Referring Provider:     Sofie Hartigan, MD Primary Care Physician:  Sofie Hartigan, MD Primary Gastroenterologist:  Dr. Sherri Sear Reason for Consultation:   Follow-up of Barrett's esophagus, chronic GERD        HPI:   Debbie Chavez is a 71 y.o. female referred by Dr. Ellison Hughs, Chrissie Noa, MD  for consultation & management of recent exacerbation of heartburn.  Patient reports that she has been experiencing nocturnal heartburn lately.  She reports that she eats around 6 PM and goes to bed 3 to 4 hours later.  She is currently on Protonix 20 mg daily for more than 1 year.  She was previously on Protonix 40 mg once a day prior to the diagnosis of osteoporosis.  She would like to know if she needs further investigation.  Follow-up visit 05/21/2022 Patient is here for follow-up of chronic GERD.  Her symptoms are under control on Protonix 20 mg daily.  NSAIDs: None  Antiplts/Anticoagulants/Anti thrombotics: None  GI Procedures:  EGD and colonoscopy 08/20/2017 - Esophageal mucosal changes suspicious for short-segment Barrett's esophagus. Biopsied. - Erythematous mucosa in the antrum. Biopsied. - 1 cm hiatal hernia. - Normal examined duodenum. Biopsied. DIAGNOSIS: A. ESOPHAGUS, 35 CM; COLD BIOPSY: - GASTRIC OXYNTIC MUCOSA WITH MILD CHRONIC GASTRITIS. - GASTRIC CARDIAC TYPE MUCOSA WITH MILD CHRONIC GASTRITIS AND REACTIVE FOVEOLAR HYPERPLASIA, CONSISTENT WITH REFLUX. - SQUAMOUS MUCOSA IS NOT PRESENT FOR EVALUATION. - NEGATIVE FOR INTESTINAL METAPLASIA, H. PYLORI, DYSPLASIA, AND MALIGNANCY.  B.  ESOPHAGUS, 34 CM; COLD BIOPSY: - BARRETT'S ESOPHAGUS. - REFLUX GASTROESOPHAGITIS. - NEGATIVE FOR DYSPLASIA AND MALIGNANCY.  C.  ESOPHAGUS, 33 CM; COLD BIOPSY: - BARRETT'S ESOPHAGUS. - NEGATIVE FOR DYSPLASIA AND MALIGNANCY.  D.   ESOPHAGUS, 32 CM; COLD BIOPSY: - BARRETT'S ESOPHAGUS. - NEGATIVE FOR DYSPLASIA AND MALIGNANCY.   - Thickened folds of the mucosa at the ileocecal valve. Biopsied. - Two 3 to 4 mm polyps in the ascending colon, removed with a cold biopsy forceps. Resected and retrieved. - Diverticulosis in the sigmoid colon. There was no evidence of diverticular bleeding. - The examination was otherwise normal. - The rectum, sigmoid colon, descending colon, transverse colon, ascending colon and cecum are normal.    Upper endoscopy 09/14/2019 - Esophageal mucosal changes classified as Barrett's stage C2-M3 per [place] criteria. Biopsied. - Small hiatal hernia. - Normal stomach. - Normal examined duodenum. DIAGNOSIS:  A. ESOPHAGUS, 34 CM; COLD BIOPSY:  - COLUMNAR MUCOSA WITH FEATURES OF MILD REFLUX.  - NEGATIVE FOR INTESTINAL METAPLASIA, DYSPLASIA, AND MALIGNANCY.   B. ESOPHAGUS, 32 CM; COLD BIOPSY:  - BARRETT'S ESOPHAGUS.  - NEGATIVE FOR DYSPLASIA AND MALIGNANCY.   C. ESOPHAGUS, 31 CM; COLD BIOPSY:  - BARRETT'S ESOPHAGUS.  - NEGATIVE FOR DYSPLASIA AND MALIGNANCY.   D. ESOPHAGUS, 30 CM; COLD BIOPSY:  - BARRETT'S ESOPHAGUS.  - NEGATIVE FOR DYSPLASIA AND MALIGNANCY.     Past Medical History:  Diagnosis Date   Anemia    Arthritis    hands, knees   Cancer ([place]) 06/2007   right breast    GERD (gastroesophageal reflux disease)    History of hiatal hernia    Hypertension     Past Surgical History:  Procedure Laterality Date   BREAST SURGERY Right 2007   lumpectomy   CATARACT EXTRACTION W/PHACO Left 01/08/2022   Procedure: CATARACT EXTRACTION  PHACO AND INTRAOCULAR LENS PLACEMENT ([facility]) LEFT;  Surgeon: Eulogio Bear, MD;  Location: [place];  Service: Ophthalmology;  Laterality: Left;  3.33 00:25.8   CATARACT EXTRACTION W/PHACO Right 01/22/2022   Procedure: CATARACT EXTRACTION PHACO AND INTRAOCULAR LENS PLACEMENT ([facility]) RIGHT;  Surgeon: Eulogio Bear, MD;  Location: [place];  Service: Ophthalmology;  Laterality: Right;  3.58 00:26.9   COLONOSCOPY     COLONOSCOPY WITH PROPOFOL N/A 08/20/2017   Procedure: COLONOSCOPY WITH PROPOFOL;  Surgeon: Virgel Manifold, MD;  Location: [place];  Service: Endoscopy;  Laterality: N/A;   ESOPHAGOGASTRODUODENOSCOPY (EGD) WITH PROPOFOL N/A 08/20/2017   Procedure: ESOPHAGOGASTRODUODENOSCOPY (EGD) WITH PROPOFOL;  Surgeon: Virgel Manifold, MD;  Location: [place];  Service: Endoscopy;  Laterality: N/A;   ESOPHAGOGASTRODUODENOSCOPY (EGD) WITH PROPOFOL N/A 02/12/2018   Procedure: ESOPHAGOGASTRODUODENOSCOPY (EGD) WITH PROPOFOL;  Surgeon: Virgel Manifold, MD;  Location: [facility];  Service: Endoscopy;  Laterality: N/A;   ESOPHAGOGASTRODUODENOSCOPY (EGD) WITH PROPOFOL N/A 09/14/2019   Procedure: ESOPHAGOGASTRODUODENOSCOPY (EGD) WITH BIOPSY;  Surgeon: Lucilla Lame, MD;  Location: [place];  Service: Endoscopy;  Laterality: N/A;   KNEE ARTHROPLASTY Left 12/14/2019   Procedure: COMPUTER ASSISTED TOTAL KNEE ARTHROPLASTY;  Surgeon: Dereck Leep, MD;  Location: [facility];  Service: Orthopedics;  Laterality: Left;   MASTECTOMY Bilateral 06/2007   no lumph nodes removed   POLYPECTOMY  08/20/2017   Procedure: POLYPECTOMY;  Surgeon: Virgel Manifold, MD;  Location: [place];  Service: Endoscopy;;   shoulder Left    TUBAL LIGATION     VAGINAL HYSTERECTOMY     Current Outpatient Medications:    Calcium Carbonate-Vitamin D 500-125 MG-UNIT TABS, Take 1 tablet by mouth daily. , Disp: , Rfl:    celecoxib (CELEBREX) 200 MG capsule, Take 200 mg by mouth daily., Disp: , Rfl:    Cholecalciferol (VITAMIN D3) 25 MCG (1000 UT) CAPS, Take 1,000 Units by mouth daily., Disp: , Rfl:    GNP IRON 200 (65 Fe) MG TABS, , Disp: , Rfl:    Multiple Vitamin (MULTIVITAMIN) tablet, Take 2 tablets by mouth daily. Gummie, Disp: , Rfl:    Omega-3 Fatty Acids (FISH OIL) 1200 MG CPDR, Take 1,200  mg by mouth daily. , Disp: , Rfl:    traMADol (ULTRAM) 50 MG tablet, Take by mouth., Disp: , Rfl:    valsartan (DIOVAN) 40 MG tablet, Take 40 mg by mouth daily., Disp: , Rfl:    pantoprazole (PROTONIX) 20 MG tablet, Take 1 tablet (20 mg total) by mouth daily before breakfast., Disp: 90 tablet, Rfl: 4   vitamin B-12 (CYANOCOBALAMIN) 1000 MCG tablet, Take 1,000 mcg by mouth daily. (Patient not taking: Reported on 05/21/2022), Disp: , Rfl:   Family History  Problem Relation Age of Onset   Cancer Mother        breast   Cancer Sister        breast     Social History   Tobacco Use   Smoking status: Never   Smokeless tobacco: Never  Vaping Use   Vaping Use: Never used  Substance Use Topics   Alcohol use: Yes    Comment: rare - Holidays   Drug use: No    Allergies as of 05/21/2022 - Review Complete 05/21/2022  Allergen Reaction Noted   Lisinopril Hives 09/08/2019   Meperidine Other (See Comments) 07/09/2017    Review of Systems:    All systems reviewed and negative except where noted in HPI.  Physical Exam:  BP 112/65 (BP Location: Right Arm, Cuff Size: Normal)   Pulse 77   Temp 98.9 F (37.2 C) (Oral)   Ht '5\' 3"'$  (1.6 m)   Wt 176 lb (79.8 kg)   BMI 31.18 kg/m  No LMP recorded. Patient is postmenopausal.  General:   Alert,  Well-developed, well-nourished, pleasant and cooperative in NAD Head:  Normocephalic and atraumatic. Eyes:  Sclera clear, no icterus.   Conjunctiva pink. Ears:  Normal auditory acuity. Nose:  No deformity, discharge, or lesions. Mouth:  No deformity or lesions,oropharynx pink & moist. Neck:  Supple; no masses or thyromegaly. Lungs:  Respirations even and unlabored.  Clear throughout to auscultation.   No wheezes, crackles, or rhonchi. No acute distress. Heart:  Regular rate and rhythm; no murmurs, clicks, rubs, or gallops. Abdomen:  Normal bowel sounds. Soft, non-tender and non-distended without masses, hepatosplenomegaly or hernias noted.  No  guarding or rebound tenderness.   Rectal: Not performed Msk:  Symmetrical without gross deformities. Good, equal movement & strength bilaterally. Pulses:  Normal pulses noted. Extremities:  No clubbing or edema.  No cyanosis. Neurologic:  Alert and oriented x3;  grossly normal neurologically. Skin:  Intact without significant lesions or rashes. No jaundice. Psych:  Alert and cooperative. Normal mood and affect.  Imaging Studies: Reviewed  Assessment and Plan:   TALAYIA HJORT is a 71 y.o. 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

## 2022-07-02 ENCOUNTER — Telehealth: Payer: Self-pay

## 2022-07-02 ENCOUNTER — Other Ambulatory Visit: Payer: Self-pay

## 2022-07-02 DIAGNOSIS — Z86010 Personal history of colonic polyps: Secondary | ICD-10-CM

## 2022-07-02 DIAGNOSIS — K2270 Barrett's esophagus without dysplasia: Secondary | ICD-10-CM

## 2022-07-02 MED ORDER — CLENPIQ 10-3.5-12 MG-GM -GM/175ML PO SOLN: 175.0000 mL | Freq: Once | ORAL | 0 refills | Status: AC

## 2022-07-02 NOTE — Telephone Encounter (Signed)
Called and left a message for call back  

## 2022-07-02 NOTE — Telephone Encounter (Signed)
-----   Message from Shelby Mattocks, [place] sent at 05/21/2022  2:59 PM EST ----- Regarding: FW: Pathology report  ----- Message ----- From: Lin Landsman, MD Sent: 05/21/2022   2:51 PM EST To: Shelby Mattocks, CMA Subject: Pathology report                               Caryl Pina  Patient had a colonoscopy at [facility] in 2019.  However, I do not see the pathology results of colon polyps in epic. She needs a surveillance colonoscopy in 07/2022 She also need surveillance EGD for Barrett's esophagus in 07/2022  Patient will call us in January 2024 to schedule his procedures Please make a reminder  Thank you  Rohini Vanga

## 2022-07-02 NOTE — Telephone Encounter (Signed)
Schedule for colonoscopy and EGD with Dr. Marius Ditch in [place] on 09/18/2022. Went over instructions, mailed them and sent to [facility]. Sent prep to the pharmacy

## 2022-09-06 ENCOUNTER — Encounter: Payer: Self-pay | Admitting: Gastroenterology

## 2022-09-18 ENCOUNTER — Other Ambulatory Visit: Payer: Self-pay

## 2022-09-18 ENCOUNTER — Ambulatory Visit
Admission: RE | Admit: 2022-09-18 | Discharge: 2022-09-18 | Disposition: A | Discharge status: Home / Self Care | Payer: Medicare Other | Attending: Gastroenterology | Admitting: Gastroenterology

## 2022-09-18 ENCOUNTER — Ambulatory Visit: Payer: Medicare Other | Admitting: Anesthesiology

## 2022-09-18 ENCOUNTER — Encounter
Admission: RE | Disposition: A | Discharge status: Home / Self Care | Payer: Self-pay | Source: Home / Self Care | Attending: Gastroenterology

## 2022-09-18 ENCOUNTER — Encounter: Payer: Self-pay | Admitting: Gastroenterology

## 2022-09-18 DIAGNOSIS — K219 Gastro-esophageal reflux disease without esophagitis: Secondary | ICD-10-CM | POA: Insufficient documentation

## 2022-09-18 DIAGNOSIS — Z1211 Encounter for screening for malignant neoplasm of colon: Secondary | ICD-10-CM | POA: Insufficient documentation

## 2022-09-18 DIAGNOSIS — Z86010 Personal history of colonic polyps: Secondary | ICD-10-CM

## 2022-09-18 DIAGNOSIS — D123 Benign neoplasm of transverse colon: Secondary | ICD-10-CM | POA: Diagnosis not present

## 2022-09-18 DIAGNOSIS — K2270 Barrett's esophagus without dysplasia: Secondary | ICD-10-CM

## 2022-09-18 DIAGNOSIS — K635 Polyp of colon: Secondary | ICD-10-CM

## 2022-09-18 DIAGNOSIS — K449 Diaphragmatic hernia without obstruction or gangrene: Secondary | ICD-10-CM | POA: Diagnosis not present

## 2022-09-18 DIAGNOSIS — K5730 Diverticulosis of large intestine without perforation or abscess without bleeding: Secondary | ICD-10-CM | POA: Diagnosis not present

## 2022-09-18 HISTORY — PX: ESOPHAGOGASTRODUODENOSCOPY (EGD) WITH PROPOFOL: SHX5813

## 2022-09-18 HISTORY — PX: POLYPECTOMY: SHX5525

## 2022-09-18 HISTORY — DX: Family history of other specified conditions: Z84.89

## 2022-09-18 HISTORY — PX: COLONOSCOPY WITH PROPOFOL: SHX5780

## 2022-09-18 SURGERY — COLONOSCOPY WITH PROPOFOL
Anesthesia: General | Site: Throat

## 2022-09-18 MED ORDER — ONDANSETRON HCL 4 MG/2ML IJ SOLN
INTRAMUSCULAR | Status: DC | PRN
  Administered 2022-09-18: 4 mg via INTRAVENOUS

## 2022-09-18 MED ORDER — LACTATED RINGERS IV SOLN: INTRAVENOUS | Status: DC

## 2022-09-18 MED ORDER — GLYCOPYRROLATE 0.2 MG/ML IJ SOLN
INTRAMUSCULAR | Status: DC | PRN
  Administered 2022-09-18: .2 mg via INTRAVENOUS

## 2022-09-18 MED ORDER — LIDOCAINE HCL (CARDIAC) PF 100 MG/5ML IV SOSY
PREFILLED_SYRINGE | INTRAVENOUS | Status: DC | PRN
  Administered 2022-09-18: 40 mg via INTRAVENOUS

## 2022-09-18 MED ORDER — SODIUM CHLORIDE 0.9 % IV SOLN: INTRAVENOUS | Status: DC

## 2022-09-18 MED ORDER — STERILE WATER FOR IRRIGATION IR SOLN
Status: DC | PRN
  Administered 2022-09-18 (×2): 1

## 2022-09-18 MED ORDER — PROPOFOL 10 MG/ML IV BOLUS
INTRAVENOUS | Status: DC | PRN
  Administered 2022-09-18: 20 mg via INTRAVENOUS
  Administered 2022-09-18: 100 ug/kg/min via INTRAVENOUS
  Administered 2022-09-18: 30 mg via INTRAVENOUS

## 2022-09-18 SURGICAL SUPPLY — 29 items
BLOCK BITE 60FR ADLT L/F GRN (MISCELLANEOUS) ×3 IMPLANT
CLIP HMST 235XBRD CATH ROT (MISCELLANEOUS) IMPLANT
CLIP RESOLUTION 360 11X235 (MISCELLANEOUS)
ELECT REM PT RETURN 9FT ADLT (ELECTROSURGICAL)
ELECTRODE REM PT RTRN 9FT ADLT (ELECTROSURGICAL) IMPLANT
FCP ESCP3.2XJMB 240X2.8X (MISCELLANEOUS)
FORCEPS BIOP RAD 4 LRG CAP 4 (CUTTING FORCEPS) IMPLANT
FORCEPS BIOP RJ4 240 W/NDL (MISCELLANEOUS)
FORCEPS ESCP3.2XJMB 240X2.8X (MISCELLANEOUS) IMPLANT
GOWN CVR UNV OPN BCK APRN NK (MISCELLANEOUS) ×6 IMPLANT
GOWN ISOL THUMB LOOP REG UNIV (MISCELLANEOUS) ×6
INJECTOR VARIJECT VIN23 (MISCELLANEOUS) IMPLANT
KIT DEFENDO VALVE AND CONN (KITS) IMPLANT
KIT PRC NS LF DISP ENDO (KITS) ×3 IMPLANT
KIT PROCEDURE OLYMPUS (KITS) ×3
MANIFOLD NEPTUNE II (INSTRUMENTS) ×3 IMPLANT
MARKER SPOT ENDO TATTOO 5ML (MISCELLANEOUS) IMPLANT
PROBE APC STR FIRE (PROBE) IMPLANT
RETRIEVER NET PLAT FOOD (MISCELLANEOUS) IMPLANT
RETRIEVER NET ROTH 2.5X230 LF (MISCELLANEOUS) IMPLANT
SNARE COLD EXACTO (MISCELLANEOUS) IMPLANT
SNARE SHORT THROW 13M SML OVAL (MISCELLANEOUS) IMPLANT
SNARE SHORT THROW 30M LRG OVAL (MISCELLANEOUS) IMPLANT
SNARE SNG USE RND 15MM (INSTRUMENTS) IMPLANT
SYR INFLATION 60ML (SYRINGE) IMPLANT
TRAP ETRAP POLY (MISCELLANEOUS) IMPLANT
VARIJECT INJECTOR VIN23 (MISCELLANEOUS)
WATER STERILE IRR 250ML POUR (IV SOLUTION) ×3 IMPLANT
WIRE CRE 18-20MM 8CM F G (MISCELLANEOUS) IMPLANT

## 2022-09-18 NOTE — Op Note (Signed)
[facility] Gastroenterology Patient Name: Debbie Chavez Procedure Date: 09/18/2022 7:35 AM MRN: [id number] Account #: [id number] Date of Birth: Dec 26, 1950 Admit Type: Outpatient Age: 72 Room: [facility] OR ROOM 01 Gender: Female Note Status: Finalized Instrument Name: [id number] Procedure:             Colonoscopy Indications:           Surveillance: Personal history of adenomatous polyps                         on last colonoscopy > 5 years ago, Last colonoscopy:                         February 2019 Providers:             Lin Landsman MD, MD Referring MD:          Sofie Hartigan (Referring MD) Medicines:             General Anesthesia Complications:         No immediate complications. Estimated blood loss: None. Procedure:             Pre-Anesthesia Assessment:                        - Prior to the procedure, a History and Physical was                         performed, and patient medications and allergies were                         reviewed. The patient is competent. The risks and                         benefits of the procedure and the sedation options and                         risks were discussed with the patient. All questions                         were answered and informed consent was obtained.                         Patient identification and proposed procedure were                         verified by the physician, the nurse, the                         anesthesiologist, the anesthetist and the technician                         in the pre-procedure area in the procedure room in the                         endoscopy suite. Mental Status Examination: alert and                         oriented. Airway Examination: normal oropharyngeal  airway and neck mobility. Respiratory Examination:                         clear to auscultation. CV Examination: normal.                         Prophylactic Antibiotics: The patient does not  require                         prophylactic antibiotics. Prior Anticoagulants: The                         patient has taken no anticoagulant or antiplatelet                         agents. ASA Grade Assessment: II - A patient with mild                         systemic disease. After reviewing the risks and                         benefits, the patient was deemed in satisfactory                         condition to undergo the procedure. The anesthesia                         plan was to use general anesthesia. Immediately prior                         to administration of medications, the patient was                         re-assessed for adequacy to receive sedatives. The                         heart rate, respiratory rate, oxygen saturations,                         blood pressure, adequacy of pulmonary ventilation, and                         response to care were monitored throughout the                         procedure. The physical status of the patient was                         re-assessed after the procedure.                        After obtaining informed consent, the colonoscope was                         passed under direct vision. Throughout the procedure,                         the patient's blood pressure, pulse, and oxygen  saturations were monitored continuously. The                         Colonoscope was introduced through the anus and                         advanced to the the cecum, identified by appendiceal                         orifice and ileocecal valve. The colonoscopy was                         performed without difficulty. The patient tolerated                         the procedure well. The quality of the bowel                         preparation was evaluated using the BBPS [facility] Bowel                         Preparation Scale) with scores of: Right Colon = 3,                         Transverse Colon = 3 and Left Colon = 3  (entire mucosa                         seen well with no residual staining, small fragments                         of stool or opaque liquid). The total BBPS score                         equals 9. The ileocecal valve, appendiceal orifice,                         and rectum were photographed. Findings:      The perianal and digital rectal examinations were normal. Pertinent       negatives include normal sphincter tone and no palpable rectal lesions.      Two sessile polyps were found in the transverse colon. The polyps were 3       to 5 mm in size. These polyps were removed with a cold snare. Resection       and retrieval were complete. Estimated blood loss: none.      Multiple small-mouthed diverticula were found in the entire colon.      The retroflexed view of the distal rectum and anal verge was normal and       showed no anal or rectal abnormalities. Impression:            - Two 3 to 5 mm polyps in the transverse colon,                         removed with a cold snare. Resected and retrieved.                        - Diverticulosis in the entire examined colon.                        -  The distal rectum and anal verge are normal on                         retroflexion view. Recommendation:        - Discharge patient to home (with escort).                        - Resume previous diet today.                        - Continue present medications.                        - Await pathology results.                        - Repeat colonoscopy in 5 to 7 years for surveillance                         based on pathology results. Procedure Code(s):     --- Professional ---                        [phone], Colonoscopy, flexible; with removal of                         tumor(s), polyp(s), or other lesion(s) by snare                         technique Diagnosis Code(s):     --- Professional ---                        Z86.010, Personal history of colonic polyps                        D12.3,  Benign neoplasm of transverse colon (hepatic                         flexure or splenic flexure)                        K57.30, Diverticulosis of large intestine without                         perforation or abscess without bleeding CPT copyright 2022 American Medical Association. All rights reserved. The codes documented in this report are preliminary and upon coder review may  be revised to meet current compliance requirements. Dr. Ulyess Mort Lin Landsman MD, MD 09/18/2022 8:29:06 AM This report has been signed electronically. Number of Addenda: 0 Note Initiated On: 09/18/2022 7:35 AM Scope Withdrawal Time: 0 hours 10 minutes 35 seconds  Total Procedure Duration: 0 hours 13 minutes 34 seconds  Estimated Blood Loss:  Estimated blood loss: none.      [facility]

## 2022-09-18 NOTE — Anesthesia Postprocedure Evaluation (Signed)
Anesthesia Post Note  Patient: MAKHIYA FICARRA  Procedure(s) Performed: COLONOSCOPY WITH PROPOFOL (Rectum) ESOPHAGOGASTRODUODENOSCOPY (EGD) WITH PROPOFOL (Throat) POLYPECTOMY (Rectum)  Patient location during evaluation: PACU Anesthesia Type: MAC Level of consciousness: awake and alert Pain management: pain level controlled Vital Signs Assessment: post-procedure vital signs reviewed and stable Respiratory status: spontaneous breathing, nonlabored ventilation, respiratory function stable and patient connected to nasal cannula oxygen Cardiovascular status: blood pressure returned to baseline and stable Postop Assessment: no apparent nausea or vomiting Anesthetic complications: no   No notable events documented.   Last Vitals:  Vitals:   09/18/22 0835 09/18/22 0839  BP: 95/65 95/65  Pulse: 83 81  Resp: 14 13  Temp:  (!) 36.4 C  SpO2: 98% 99%    Last Pain:  Vitals:   09/18/22 0839  TempSrc:   PainSc: 0-No pain                 Arita Miss

## 2022-09-18 NOTE — Op Note (Signed)
[facility] Gastroenterology Patient Name: Debbie Chavez Procedure Date: 09/18/2022 7:35 AM MRN: [id number] Account #: [id number] Date of Birth: 1951/06/07 Admit Type: Outpatient Age: 72 Room: [facility] OR ROOM 01 Gender: Female Note Status: Finalized Instrument Name: [id number] Procedure:             Upper GI endoscopy Indications:           Surveillance for malignancy due to personal history of                         Barrett's esophagus Providers:             Lin Landsman MD, MD Referring MD:          Sofie Hartigan (Referring MD) Medicines:             General Anesthesia Complications:         No immediate complications. Estimated blood loss: None. Procedure:             Pre-Anesthesia Assessment:                        - Prior to the procedure, a History and Physical was                         performed, and patient medications and allergies were                         reviewed. The patient is competent. The risks and                         benefits of the procedure and the sedation options and                         risks were discussed with the patient. All questions                         were answered and informed consent was obtained.                         Patient identification and proposed procedure were                         verified by the physician, the nurse, the                         anesthesiologist, the anesthetist and the technician                         in the pre-procedure area in the procedure room in the                         endoscopy suite. Mental Status Examination: alert and                         oriented. Airway Examination: normal oropharyngeal                         airway and neck mobility. Respiratory Examination:  clear to auscultation. CV Examination: normal.                         Prophylactic Antibiotics: The patient does not require                         prophylactic antibiotics.  Prior Anticoagulants: The                         patient has taken no anticoagulant or antiplatelet                         agents. ASA Grade Assessment: II - A patient with mild                         systemic disease. After reviewing the risks and                         benefits, the patient was deemed in satisfactory                         condition to undergo the procedure. The anesthesia                         plan was to use general anesthesia. Immediately prior                         to administration of medications, the patient was                         re-assessed for adequacy to receive sedatives. The                         heart rate, respiratory rate, oxygen saturations,                         blood pressure, adequacy of pulmonary ventilation, and                         response to care were monitored throughout the                         procedure. The physical status of the patient was                         re-assessed after the procedure.                        After obtaining informed consent, the endoscope was                         passed under direct vision. Throughout the procedure,                         the patient's blood pressure, pulse, and oxygen                         saturations were monitored continuously. The Endoscope  was introduced through the mouth, and advanced to the                         second part of duodenum. The upper GI endoscopy was                         accomplished without difficulty. The patient tolerated                         the procedure well. Findings:      The duodenal bulb and second portion of the duodenum were normal.      The entire examined stomach was normal.      A medium-sized hiatal hernia was present.      The cardia and gastric fundus were normal on retroflexion.      The esophagus and gastroesophageal junction were examined with white       light and narrow band imaging (NBI) from  a forward view and retroflexed       position. There were esophageal mucosal changes secondary to established       short-segment Barrett's disease. These changes involved the mucosa at       the upper extent of the gastric folds (30 cm from the incisors)       extending to the Z-line (29 cm from the incisors). One tongue of       salmon-colored mucosa was present from 29 to 30 cm and no visible       abnormalities were present. The maximum longitudinal extent of these       esophageal mucosal changes was 1 cm in length. Mucosa was biopsied with       a cold forceps for histology. One specimen bottle was sent to pathology.       Estimated blood loss: none. Impression:            - Normal duodenal bulb and second portion of the                         duodenum.                        - Normal stomach.                        - Medium-sized hiatal hernia.                        - Esophageal mucosal changes secondary to established                         short-segment Barrett's disease. Biopsied. Recommendation:        - Follow an antireflux regimen indefinitely.                        - Continue present medications.                        - Use a proton pump inhibitor PO daily indefinitely.                        - Proceed with colonoscopy as scheduled  See colonoscopy report Procedure Code(s):     --- Professional ---                        [phone], Esophagogastroduodenoscopy, flexible,                         transoral; with biopsy, single or multiple Diagnosis Code(s):     --- Professional ---                        K22.70, Barrett's esophagus without dysplasia                        K44.9, Diaphragmatic hernia without obstruction or                         gangrene CPT copyright 2022 American Medical Association. All rights reserved. The codes documented in this report are preliminary and upon coder review may  be revised to meet current compliance  requirements. Dr. Ulyess Mort Lin Landsman MD, MD 09/18/2022 8:11:46 AM This report has been signed electronically. Number of Addenda: 0 Note Initiated On: 09/18/2022 7:35 AM Total Procedure Duration: 0 hours 8 minutes 11 seconds  Estimated Blood Loss:  Estimated blood loss: none.      [facility]

## 2022-09-18 NOTE — H&P (Signed)
Cephas Darby, MD [address]  [address]  [place], [place] [address]  Main: [phone]  Fax: [phone] Pager: [phone]  Primary Care Physician:  Sofie Hartigan, MD Primary Gastroenterologist:  Dr. Cephas Darby  Pre-Procedure History & Physical: HPI:  Debbie Chavez is a 71 y.o. female is here for an endoscopy and colonoscopy.   Past Medical History:  Diagnosis Date   Anemia    Arthritis    hands, knees   Cancer ([place]) 06/2007   right breast    Family history of adverse reaction to anesthesia    GERD (gastroesophageal reflux disease)    History of hiatal hernia    Hypertension     Past Surgical History:  Procedure Laterality Date   BREAST SURGERY Right 2007   lumpectomy   CATARACT EXTRACTION W/PHACO Left 01/08/2022   Procedure: CATARACT EXTRACTION PHACO AND INTRAOCULAR LENS PLACEMENT ([place]) LEFT;  Surgeon: Eulogio Bear, MD;  Location: [place];  Service: Ophthalmology;  Laterality: Left;  3.33 00:25.8   CATARACT EXTRACTION W/PHACO Right 01/22/2022   Procedure: CATARACT EXTRACTION PHACO AND INTRAOCULAR LENS PLACEMENT ([facility]) RIGHT;  Surgeon: Eulogio Bear, MD;  Location: [place];  Service: Ophthalmology;  Laterality: Right;  3.58 00:26.9   COLONOSCOPY     COLONOSCOPY WITH PROPOFOL N/A 08/20/2017   Procedure: COLONOSCOPY WITH PROPOFOL;  Surgeon: Virgel Manifold, MD;  Location: [place];  Service: Endoscopy;  Laterality: N/A;   ESOPHAGOGASTRODUODENOSCOPY (EGD) WITH PROPOFOL N/A 08/20/2017   Procedure: ESOPHAGOGASTRODUODENOSCOPY (EGD) WITH PROPOFOL;  Surgeon: Virgel Manifold, MD;  Location: [place];  Service: Endoscopy;  Laterality: N/A;   ESOPHAGOGASTRODUODENOSCOPY (EGD) WITH PROPOFOL N/A 02/12/2018   Procedure: ESOPHAGOGASTRODUODENOSCOPY (EGD) WITH PROPOFOL;  Surgeon: Virgel Manifold, MD;  Location: [facility];  Service: Endoscopy;  Laterality: N/A;   ESOPHAGOGASTRODUODENOSCOPY (EGD)  WITH PROPOFOL N/A 09/14/2019   Procedure: ESOPHAGOGASTRODUODENOSCOPY (EGD) WITH BIOPSY;  Surgeon: Lucilla Lame, MD;  Location: [place];  Service: Endoscopy;  Laterality: N/A;   KNEE ARTHROPLASTY Left 12/14/2019   Procedure: COMPUTER ASSISTED TOTAL KNEE ARTHROPLASTY;  Surgeon: Dereck Leep, MD;  Location: [facility];  Service: Orthopedics;  Laterality: Left;   MASTECTOMY Bilateral 06/2007   no lumph nodes removed   POLYPECTOMY  08/20/2017   Procedure: POLYPECTOMY;  Surgeon: Virgel Manifold, MD;  Location: [place];  Service: Endoscopy;;   shoulder Left    TUBAL LIGATION     VAGINAL HYSTERECTOMY      Prior to Admission medications   Medication Sig Start Date End Date Taking? Authorizing Provider  Calcium Carbonate-Vitamin D 500-125 MG-UNIT TABS Take 1 tablet by mouth daily.    Yes [provider]  celecoxib (CELEBREX) 200 MG capsule Take 200 mg by mouth daily. 02/24/21  Yes [provider]  Cholecalciferol (VITAMIN D3) 25 MCG (1000 UT) CAPS Take 1,000 Units by mouth daily.   Yes [provider]  GNP IRON 200 (65 Fe) MG TABS  12/28/21  Yes [provider]  Multiple Vitamin (MULTIVITAMIN) tablet Take 2 tablets by mouth daily. Gummie   Yes [provider]  Omega-3 Fatty Acids (FISH OIL) 1200 MG CPDR Take 1,200 mg by mouth daily.    Yes [provider]  pantoprazole (PROTONIX) 20 MG tablet Take 1 tablet (20 mg total) by mouth daily before breakfast. 05/21/22  Yes Even Budlong, Tally Due, MD  traMADol (ULTRAM) 50 MG tablet Take by mouth. 08/26/20  Yes [provider]  valsartan (  DIOVAN) 40 MG tablet Take 40 mg by mouth daily.   Yes [provider]  vitamin B-12 (CYANOCOBALAMIN) 1000 MCG tablet Take 1,000 mcg by mouth daily. Patient not taking: Reported on 05/21/2022    [provider]    Allergies as of 07/02/2022 - Review Complete 05/21/2022  Allergen Reaction Noted   Lisinopril Hives  09/08/2019   Meperidine Other (See Comments) 07/09/2017    Family History  Problem Relation Age of Onset   Cancer Mother        breast   Cancer Sister        breast    Social History   Socioeconomic History   Marital status: Married    Spouse name: Not on file   Number of children: Not on file   Years of education: Not on file   Highest education level: Not on file  Occupational History   Not on file  Tobacco Use   Smoking status: Never   Smokeless tobacco: Never  Vaping Use   Vaping Use: Never used  Substance and Sexual Activity   Alcohol use: Yes    Comment: rare - Holidays   Drug use: No   Sexual activity: Not on file  Other Topics Concern   Not on file  Social History Narrative   Not on file   Social Determinants of Health   Financial Resource Strain: Not on file  Food Insecurity: Not on file  Transportation Needs: Not on file  Physical Activity: Not on file  Stress: Not on file  Social Connections: Not on file  Intimate Partner Violence: Not on file    Review of Systems: See HPI, otherwise negative ROS  Physical Exam: BP 115/65   Pulse 65   Temp (!) 97.5 F (36.4 C) (Temporal)   Resp 15   Ht 5\' 3"  (1.6 m)   Wt 73.4 kg   SpO2 99%   BMI 28.66 kg/m  General:   Alert,  pleasant and cooperative in NAD Head:  Normocephalic and atraumatic. Neck:  Supple; no masses or thyromegaly. Lungs:  Clear throughout to auscultation.    Heart:  Regular rate and rhythm. Abdomen:  Soft, nontender and nondistended. Normal bowel sounds, without guarding, and without rebound.   Neurologic:  Alert and  oriented x4;  grossly normal neurologically.  Impression/Plan: Debbie Chavez is here for an upper endoscopy and colonoscopy to be performed for history of short segment Barrett's esophagus without dysplasia, chronic GERD,  Recommend surveillance colonoscopy given history of colon polyps   Risks, benefits, limitations, and alternatives regarding  endoscopy and  colonoscopy have been reviewed with the patient.  Questions have been answered.  All parties agreeable.   Sherri Sear, MD  09/18/2022, 7:45 AM

## 2022-09-18 NOTE — Anesthesia Preprocedure Evaluation (Signed)
Anesthesia Evaluation  Patient identified by MRN, date of birth, ID band Patient awake    Reviewed: Allergy & Precautions, H&P , NPO status , Patient's Chart, lab work & pertinent test results  History of Anesthesia Complications Negative for: history of anesthetic complications  Airway Mallampati: II  TM Distance: >3 FB Neck ROM: full    Dental no notable dental hx.    Pulmonary neg pulmonary ROS, neg sleep apnea, neg COPD, Patient abstained from smoking.Not current smoker   Pulmonary exam normal breath sounds clear to auscultation       Cardiovascular Exercise Tolerance: Good METShypertension, On Medications (-) CAD and (-) Past MI Normal cardiovascular exam(-) dysrhythmias      Neuro/Psych negative neurological ROS  negative psych ROS   GI/Hepatic Neg liver ROS, hiatal hernia,GERD  Medicated,,  Endo/Other  negative endocrine ROSneg diabetes    Renal/GU negative Renal ROS  negative genitourinary   Musculoskeletal  (+) Arthritis ,    Abdominal  (+) + obese (BMI 32)  Peds  Hematology  (+) Blood dyscrasia, anemia   Anesthesia Other Findings Past Medical History: No date: Anemia No date: Arthritis     Comment:  hands, knees 06/2007: Cancer ([place])     Comment:  right breast  No date: Family history of adverse reaction to anesthesia No date: GERD (gastroesophageal reflux disease) No date: History of hiatal hernia No date: Hypertension  Reproductive/Obstetrics                              Anesthesia Physical Anesthesia Plan  ASA: 2  Anesthesia Plan: MAC   Post-op Pain Management: Minimal or no pain anticipated   Induction: Intravenous  PONV Risk Score and Plan: 2 and TIVA, Midazolam and Treatment may vary due to age or medical condition  Airway Management Planned: Nasal Cannula and Natural Airway  Additional Equipment:   Intra-op Plan:   Post-operative Plan:   Informed  Consent: I have reviewed the patients History and Physical, chart, labs and discussed the procedure including the risks, benefits and alternatives for the proposed anesthesia with the patient or authorized representative who has indicated his/her understanding and acceptance.     Dental advisory given  Plan Discussed with: CRNA  Anesthesia Plan Comments:          Anesthesia Quick Evaluation

## 2022-09-18 NOTE — Transfer of Care (Signed)
Immediate Anesthesia Transfer of Care Note  Patient: Debbie Chavez  Procedure(s) Performed: COLONOSCOPY WITH PROPOFOL (Rectum) ESOPHAGOGASTRODUODENOSCOPY (EGD) WITH PROPOFOL (Throat) POLYPECTOMY (Rectum)  Patient Location: PACU  Anesthesia Type: MAC  Level of Consciousness: awake, alert  and patient cooperative  Airway and Oxygen Therapy: Patient Spontanous Breathing and Patient connected to supplemental oxygen  Post-op Assessment: Post-op Vital signs reviewed, Patient's Cardiovascular Status Stable, Respiratory Function Stable, Patent Airway and No signs of Nausea or vomiting  Post-op Vital Signs: Reviewed and stable  Complications: No notable events documented.

## 2022-09-19 ENCOUNTER — Encounter: Payer: Self-pay | Admitting: Gastroenterology

## 2022-09-20 ENCOUNTER — Encounter: Payer: Self-pay | Admitting: Gastroenterology

## 2022-09-20 LAB — SURGICAL PATHOLOGY

## 2022-12-21 ENCOUNTER — Other Ambulatory Visit: Payer: Self-pay | Admitting: Orthopedic Surgery

## 2022-12-21 DIAGNOSIS — M5416 Radiculopathy, lumbar region: Secondary | ICD-10-CM

## 2022-12-30 ENCOUNTER — Ambulatory Visit
Admission: RE | Admit: 2022-12-30 | Discharge: 2022-12-30 | Disposition: A | Discharge status: Home / Self Care | Payer: Medicare Other | Source: Ambulatory Visit | Attending: Orthopedic Surgery | Admitting: Orthopedic Surgery

## 2022-12-30 DIAGNOSIS — M5416 Radiculopathy, lumbar region: Secondary | ICD-10-CM | POA: Insufficient documentation

## 2023-01-11 NOTE — Discharge Instructions (Addendum)
Instructions after Total Hip Replacement   James P. Angie Fava., M.D.    Dept. of Orthopaedics & Sports Medicine [facility] [address] [place], [place]  [address]  Phone: [phone]   Fax: [phone]        [email or web address]        DIET: Drink plenty of non-alcoholic fluids. Resume your normal diet. Include foods high in fiber.  ACTIVITY:  You may use crutches or a walker with weight-bearing as tolerated, unless instructed otherwise. You may be weaned off of the walker or crutches by your Physical Therapist.  Do NOT reach below the level of your knees or cross your legs until allowed.    Continue doing gentle exercises. Exercising will reduce the pain and swelling, increase motion, and prevent muscle weakness.   Please continue to use the TED compression stockings for 6 weeks. You may remove the stockings at night, but should reapply them in the morning. Do not drive or operate any equipment until instructed.  WOUND CARE:  Continue to use ice packs periodically to reduce pain and swelling. Keep the incision clean and dry. You may bathe or shower after the staples are removed at the first office visit following surgery. The Aquacel bandage remains in place for 7 days postoperatively.  This can be changed out with a honeycomb bandage.  At home PT can help with this.  MEDICATIONS: You may resume your regular medications. Please take the pain medication as prescribed on the medication. Do not take pain medication on an empty stomach. You have been given a prescription for a blood thinner to prevent blood clots -aspirin 81 mg.  Please take this once in the morning once at night.  You use this in conjunction with your TED hose for 6 weeks to help prevent DVTs. Opioids can cause constipation. Use a stool softener (Senokot or Colace) on a daily basis and a laxative (dulcolax or miralax) as needed. Do not drive or drink alcoholic beverages when taking pain  medications.  CALL THE OFFICE FOR: Temperature above 101 degrees Excessive bleeding or drainage on the dressing. Excessive swelling, coldness, or paleness of the toes. Persistent nausea and vomiting.  FOLLOW-UP:  You should have an appointment to return to the office in 6 weeks after surgery. Arrangements have been made for continuation of Physical Therapy (either home therapy or outpatient therapy).     [facility] Department Directory         [email or web address]       [email or web address]          Cardiology  Appointments: [place] - [phone]  Endocrinology  Appointments: [place] [phone] [place] - [phone]  Gastroenterology  Appointments: [place] [phone] [place] - [phone]        General Surgery   Appointments: [facility]  Internal Medicine/Family Medicine  Appointments: [facility] [place] - [phone] [place] - [phone]  Metabolic and Weigh Loss Surgery  Appointments: [facility]        Neurology  Appointments: [place] [phone] [place] - [phone]  Neurosurgery  Appointments: [place]  Obstetrics & Gynecology  Appointments: [place] [phone] [place] - [phone]        Pediatrics  Appointments: [place] [phone] [place] - [phone]  Physiatry  Appointments: [place] [phone]  Physical Therapy  Appointments: [facility] [place] - [phone]        Podiatry  Appointments: [place] [phone] [place] - [phone]  Pulmonology  Appointments: [place] [phone]  Rheumatology  Appointments: [facility]        [facility] Location: [facility]  [address] [place], [place]  [address]  [place] Location: [facility] [address] [place], [place]  [address]  [place] Location: [facility] [address] [place], [place]  [address]

## 2023-01-16 ENCOUNTER — Other Ambulatory Visit: Payer: Self-pay

## 2023-01-16 ENCOUNTER — Encounter
Admission: RE | Admit: 2023-01-16 | Discharge: 2023-01-16 | Disposition: A | Discharge status: Home / Self Care | Payer: Medicare Other | Source: Ambulatory Visit | Attending: Orthopedic Surgery | Admitting: Orthopedic Surgery

## 2023-01-16 VITALS — BP 125/86 | HR 70 | Resp 16 | Ht 63.0 in | Wt 176.4 lb

## 2023-01-16 DIAGNOSIS — Z01810 Encounter for preprocedural cardiovascular examination: Secondary | ICD-10-CM | POA: Diagnosis not present

## 2023-01-16 DIAGNOSIS — D649 Anemia, unspecified: Secondary | ICD-10-CM | POA: Diagnosis not present

## 2023-01-16 DIAGNOSIS — M1612 Unilateral primary osteoarthritis, left hip: Secondary | ICD-10-CM | POA: Insufficient documentation

## 2023-01-16 DIAGNOSIS — Z01818 Encounter for other preprocedural examination: Secondary | ICD-10-CM | POA: Diagnosis not present

## 2023-01-16 DIAGNOSIS — Z01812 Encounter for preprocedural laboratory examination: Secondary | ICD-10-CM

## 2023-01-16 HISTORY — DX: Other intervertebral disc degeneration, lumbar region: M51.36

## 2023-01-16 HISTORY — DX: Diverticulosis of large intestine without perforation or abscess without bleeding: K57.30

## 2023-01-16 HISTORY — DX: Lumbago with sciatica, left side: M54.42

## 2023-01-16 HISTORY — DX: Reaction to severe stress, unspecified: F43.9

## 2023-01-16 HISTORY — DX: Benign neoplasm of ascending colon: D12.2

## 2023-01-16 HISTORY — DX: Cystocele, unspecified: N81.10

## 2023-01-16 HISTORY — DX: Calculus of gallbladder without cholecystitis without obstruction: K80.20

## 2023-01-16 HISTORY — DX: Stress incontinence (female) (male): N39.3

## 2023-01-16 HISTORY — DX: Other intervertebral disc degeneration, lumbar region without mention of lumbar back pain or lower extremity pain: M51.369

## 2023-01-16 HISTORY — DX: Other complications of anesthesia, initial encounter: T88.59XA

## 2023-01-16 HISTORY — DX: Barrett's esophagus without dysplasia: K22.70

## 2023-01-16 HISTORY — DX: Chronic kidney disease, stage 3a: N18.31

## 2023-01-16 HISTORY — DX: Unilateral primary osteoarthritis, left hip: M16.12

## 2023-01-16 HISTORY — DX: Age-related osteoporosis without current pathological fracture: M81.0

## 2023-01-16 HISTORY — DX: Pure hypercholesterolemia, unspecified: E78.00

## 2023-01-16 HISTORY — DX: Hyperlipidemia, unspecified: E78.5

## 2023-01-16 LAB — CBC WITH DIFFERENTIAL/PLATELET
Abs Immature Granulocytes: 0.01 10*3/uL (ref 0.00–0.07)
Basophils Absolute: 0.1 10*3/uL (ref 0.0–0.1)
Basophils Relative: 1 %
Eosinophils Absolute: 0.1 10*3/uL (ref 0.0–0.5)
Eosinophils Relative: 3 %
HCT: 35.4 % — ABNORMAL LOW (ref 36.0–46.0)
Hemoglobin: 11.7 g/dL — ABNORMAL LOW (ref 12.0–15.0)
Immature Granulocytes: 0 %
Lymphocytes Relative: 23 %
Lymphs Abs: 1 10*3/uL (ref 0.7–4.0)
MCH: 31 pg (ref 26.0–34.0)
MCHC: 33.1 g/dL (ref 30.0–36.0)
MCV: 93.9 fL (ref 80.0–100.0)
Monocytes Absolute: 0.3 10*3/uL (ref 0.1–1.0)
Monocytes Relative: 8 %
Neutro Abs: 3 10*3/uL (ref 1.7–7.7)
Neutrophils Relative %: 65 %
Platelets: 234 10*3/uL (ref 150–400)
RBC: 3.77 MIL/uL — ABNORMAL LOW (ref 3.87–5.11)
RDW: 12.2 % (ref 11.5–15.5)
WBC: 4.5 10*3/uL (ref 4.0–10.5)
nRBC: 0 % (ref 0.0–0.2)

## 2023-01-16 LAB — URINALYSIS, ROUTINE W REFLEX MICROSCOPIC
Bilirubin Urine: NEGATIVE
Glucose, UA: NEGATIVE mg/dL
Ketones, ur: NEGATIVE mg/dL
Nitrite: NEGATIVE
Protein, ur: NEGATIVE mg/dL
Specific Gravity, Urine: 1.008 (ref 1.005–1.030)
pH: 5 (ref 5.0–8.0)

## 2023-01-16 LAB — COMPREHENSIVE METABOLIC PANEL
ALT: 21 U/L (ref 0–44)
AST: 27 U/L (ref 15–41)
Albumin: 3.9 g/dL (ref 3.5–5.0)
Alkaline Phosphatase: 55 U/L (ref 38–126)
Anion gap: 5 (ref 5–15)
BUN: 20 mg/dL (ref 8–23)
CO2: 24 mmol/L (ref 22–32)
Calcium: 8.8 mg/dL — ABNORMAL LOW (ref 8.9–10.3)
Chloride: 109 mmol/L (ref 98–111)
Creatinine, Ser: 1.05 mg/dL — ABNORMAL HIGH (ref 0.44–1.00)
GFR, Estimated: 57 mL/min — ABNORMAL LOW (ref >60)
Glucose, Bld: 81 mg/dL (ref 70–99)
Potassium: 3.7 mmol/L (ref 3.5–5.1)
Sodium: 138 mmol/L (ref 135–145)
Total Bilirubin: 1.1 mg/dL (ref 0.3–1.2)
Total Protein: 6.8 g/dL (ref 6.5–8.1)

## 2023-01-16 LAB — TYPE AND SCREEN
ABO/RH(D): B NEG
Antibody Screen: NEGATIVE

## 2023-01-16 LAB — C-REACTIVE PROTEIN: CRP: 0.8 mg/dL (ref <1.0)

## 2023-01-16 LAB — SURGICAL PCR SCREEN
MRSA, PCR: NEGATIVE
Staphylococcus aureus: POSITIVE — AB

## 2023-01-16 LAB — SEDIMENTATION RATE: Sed Rate: 33 mm/hr — ABNORMAL HIGH (ref 0–30)

## 2023-01-16 NOTE — Patient Instructions (Addendum)
Your procedure is scheduled on: 01/28/23 - Monday Report to the Registration Desk on the 1st floor of the [facility]. To find out your arrival time, please call [phone] between 1PM - 3PM on: 01/25/23 - Friday If your arrival time is 6:00 am, do not arrive before that time as the [facility] entrance doors do not open until 6:00 am.  REMEMBER: Instructions that are not followed completely may result in serious medical risk, up to and including death; or upon the discretion of your surgeon and anesthesiologist your surgery may need to be rescheduled.  Do not eat food after midnight the night before surgery.  No gum chewing or hard candies.  You may however, drink CLEAR liquids up to 2 hours before you are scheduled to arrive for your surgery. Do not drink anything within 2 hours of your scheduled arrival time.  Clear liquids include: - water  - apple juice without pulp - gatorade (not RED colors) - black coffee or tea (Do NOT add milk or creamers to the coffee or tea) Do NOT drink anything that is not on this list.   In addition, your doctor has ordered for you to drink the provided:  Ensure Pre-Surgery Clear Carbohydrate Drink  Drinking this carbohydrate drink up to two hours before surgery helps to reduce insulin resistance and improve patient outcomes. Please complete drinking 2 hours before scheduled arrival time.  One week prior to surgery:01/21/23:  Stop Anti-inflammatories (NSAIDS) such as Advil, Aleve, Ibuprofen, Motrin, Naproxen, Naprosyn and Aspirin based products such as Excedrin, Goody's Powder, BC Powder. You may however, continue to take Tylenol if needed for pain up until the day of surgery.  Stop beginning 01/21/23, ANY OVER THE COUNTER supplements until after surgery.  Continue taking all prescribed medications with the exception of the following:   TAKE ONLY THESE MEDICATIONS THE MORNING OF SURGERY WITH A SIP OF WATER:  pantoprazole (PROTONIX) - (take one  the night before and one on the morning of surgery - helps to prevent nausea after surgery.) traMADol (ULTRAM) if needed.   No Alcohol for 24 hours before or after surgery.  No Smoking including e-cigarettes for 24 hours before surgery.  No chewable tobacco products for at least 6 hours before surgery.  No nicotine patches on the day of surgery.  Do not use any "recreational" drugs for at least a week (preferably 2 weeks) before your surgery.  Please be advised that the combination of cocaine and anesthesia may have negative outcomes, up to and including death.If you test positive for cocaine, your surgery will be cancelled.  On the morning of surgery brush your teeth with toothpaste and water, you may rinse your mouth with mouthwash if you wish. Do not swallow any toothpaste or mouthwash.  Use CHG Soap or wipes as directed on instruction sheet.  Do not wear jewelry, make-up, hairpins, clips or nail polish.  Do not wear lotions, powders, or perfumes.   Contact lenses, hearing aids and dentures may not be worn into surgery.  Do not bring valuables to the hospital. [facility] is not responsible for any missing/lost belongings or valuables.   Notify your doctor if there is any change in your medical condition (cold, fever, infection).  Wear comfortable clothing (specific to your surgery type) to the hospital.  After surgery, you can help prevent lung complications by doing breathing exercises.  Take deep breaths and cough every 1-2 hours. Your doctor may order a device called an Incentive Spirometer to help  you take deep breaths. When coughing or sneezing, hold a pillow firmly against your incision with both hands. This is called "splinting." Doing this helps protect your incision. It also decreases belly discomfort.  If you are being admitted to the hospital overnight, leave your suitcase in the car. After surgery it may be brought to your room.  In case of increased patient census,  it may be necessary for you, the patient, to continue your postoperative care in the Same Day Surgery department.  If you are being discharged the day of surgery, you will not be allowed to drive home. You will need a responsible individual to drive you home and stay with you for 24 hours after surgery.   If you are taking public transportation, you will need to have a responsible individual with you.  Please call the Pre-admissions Testing Dept. at [phone] if you have any questions about these instructions.  Surgery Visitation Policy:  Patients having surgery or a procedure may have two visitors.  Children under the age of 52 must have an adult with them who is not the patient.  Inpatient Visitation:    Visiting hours are 7 a.m. to 8 p.m. Up to four visitors are allowed at one time in a patient room. The visitors may rotate out with other people during the day.  One visitor age 48 or older may stay with the patient overnight and must be in the room by 8 p.m.   Pre-operative 5 CHG Bath Instructions   You can play a key role in reducing the risk of infection after surgery. Your skin needs to be as free of germs as possible. You can reduce the number of germs on your skin by washing with CHG (chlorhexidine gluconate) soap before surgery. CHG is an antiseptic soap that kills germs and continues to kill germs even after washing.   DO NOT use if you have an allergy to chlorhexidine/CHG or antibacterial soaps. If your skin becomes reddened or irritated, stop using the CHG and notify one of our RNs at [phone].   Please shower with the CHG soap starting 4 days before surgery using the following schedule: 01/24/23 - 01/28/23.    Please keep in mind the following:  DO NOT shave, including legs and underarms, starting the day of your first shower.   You may shave your face at any point before/day of surgery.  Place clean sheets on your bed the day you start using CHG soap. Use a  clean washcloth (not used since being washed) for each shower. DO NOT sleep with pets once you start using the CHG.   CHG Shower Instructions:  If you choose to wash your hair and private area, wash first with your normal shampoo/soap.  After you use shampoo/soap, rinse your hair and body thoroughly to remove shampoo/soap residue.  Turn the water OFF and apply about 3 tablespoons (45 ml) of CHG soap to a CLEAN washcloth.  Apply CHG soap ONLY FROM YOUR NECK DOWN TO YOUR TOES (washing for 3-5 minutes)  DO NOT use CHG soap on face, private areas, open wounds, or sores.  Pay special attention to the area where your surgery is being performed.  If you are having back surgery, having someone wash your back for you may be helpful. Wait 2 minutes after CHG soap is applied, then you may rinse off the CHG soap.  Pat dry with a clean towel  Put on clean clothes/pajamas   If you choose to wear  lotion, please use ONLY the CHG-compatible lotions on the back of this paper.     Additional instructions for the day of surgery: DO NOT APPLY any lotions, deodorants, cologne, or perfumes.   Put on clean/comfortable clothes.  Brush your teeth.  Ask your nurse before applying any prescription medications to the skin.      CHG Compatible Lotions   Aveeno Moisturizing lotion  Cetaphil Moisturizing Cream  Cetaphil Moisturizing Lotion  Clairol Herbal Essence Moisturizing Lotion, Dry Skin  Clairol Herbal Essence Moisturizing Lotion, Extra Dry Skin  Clairol Herbal Essence Moisturizing Lotion, Normal Skin  Curel Age Defying Therapeutic Moisturizing Lotion with Alpha Hydroxy  Curel Extreme Care Body Lotion  Curel Soothing Hands Moisturizing Hand Lotion  Curel Therapeutic Moisturizing Cream, Fragrance-Free  Curel Therapeutic Moisturizing Lotion, Fragrance-Free  Curel Therapeutic Moisturizing Lotion, Original Formula  Eucerin Daily Replenishing Lotion  Eucerin Dry Skin Therapy Plus Alpha Hydroxy Crme   Eucerin Dry Skin Therapy Plus Alpha Hydroxy Lotion  Eucerin Original Crme  Eucerin Original Lotion  Eucerin Plus Crme Eucerin Plus Lotion  Eucerin TriLipid Replenishing Lotion  Keri Anti-Bacterial Hand Lotion  Keri Deep Conditioning Original Lotion Dry Skin Formula Softly Scented  Keri Deep Conditioning Original Lotion, Fragrance Free Sensitive Skin Formula  Keri Lotion Fast Absorbing Fragrance Free Sensitive Skin Formula  Keri Lotion Fast Absorbing Softly Scented Dry Skin Formula  Keri Original Lotion  Keri Skin Renewal Lotion Keri Silky Smooth Lotion  Keri Silky Smooth Sensitive Skin Lotion  Nivea Body Creamy Conditioning Oil  Nivea Body Extra Enriched Lotion  Nivea Body Original Lotion  Nivea Body Sheer Moisturizing Lotion Nivea Crme  Nivea Skin Firming Lotion  NutraDerm 30 Skin Lotion  NutraDerm Skin Lotion  NutraDerm Therapeutic Skin Cream  NutraDerm Therapeutic Skin Lotion  ProShield Protective Hand Cream  Provon moisturizing lotion How to Use an Incentive Spirometer  An incentive spirometer is a tool that measures how well you are filling your lungs with each breath. Learning to take long, deep breaths using this tool can help you keep your lungs clear and active. This may help to reverse or lessen your chance of developing breathing (pulmonary) problems, especially infection. You may be asked to use a spirometer: After a surgery. If you have a lung problem or a history of smoking. After a long period of time when you have been unable to move or be active. If the spirometer includes an indicator to show the highest number that you have reached, your health care provider or respiratory therapist will help you set a goal. Keep a log of your progress as told by your health care provider. What are the risks? Breathing too quickly may cause dizziness or cause you to pass out. Take your time so you do not get dizzy or light-headed. If you are in pain, you may need to take pain  medicine before doing incentive spirometry. It is harder to take a deep breath if you are having pain. How to use your incentive spirometer  Sit up on the edge of your bed or on a chair. Hold the incentive spirometer so that it is in an upright position. Before you use the spirometer, breathe out normally. Place the mouthpiece in your mouth. Make sure your lips are closed tightly around it. Breathe in slowly and as deeply as you can through your mouth, causing the piston or the ball to rise toward the top of the chamber. Hold your breath for 3-5 seconds, or for as long as possible. If  the spirometer includes a coach indicator, use this to guide you in breathing. Slow down your breathing if the indicator goes above the marked areas. Remove the mouthpiece from your mouth and breathe out normally. The piston or ball will return to the bottom of the chamber. Rest for a few seconds, then repeat the steps 10 or more times. Take your time and take a few normal breaths between deep breaths so that you do not get dizzy or light-headed. Do this every 1-2 hours when you are awake. If the spirometer includes a goal marker to show the highest number you have reached (best effort), use this as a goal to work toward during each repetition. After each set of 10 deep breaths, cough a few times. This will help to make sure that your lungs are clear. If you have an incision on your chest or abdomen from surgery, place a pillow or a rolled-up towel firmly against the incision when you cough. This can help to reduce pain while taking deep breaths and coughing. General tips When you are able to get out of bed: Walk around often. Continue to take deep breaths and cough in order to clear your lungs. Keep using the incentive spirometer until your health care provider says it is okay to stop using it. If you have been in the hospital, you may be told to keep using the spirometer at home. Contact a health care provider  if: You are having difficulty using the spirometer. You have trouble using the spirometer as often as instructed. Your pain medicine is not giving enough relief for you to use the spirometer as told. You have a fever. Get help right away if: You develop shortness of breath. You develop a cough with bloody mucus from the lungs. You have fluid or blood coming from an incision site after you cough. Summary An incentive spirometer is a tool that can help you learn to take long, deep breaths to keep your lungs clear and active. You may be asked to use a spirometer after a surgery, if you have a lung problem or a history of smoking, or if you have been inactive for a long period of time. Use your incentive spirometer as instructed every 1-2 hours while you are awake. If you have an incision on your chest or abdomen, place a pillow or a rolled-up towel firmly against your incision when you cough. This will help to reduce pain. Get help right away if you have shortness of breath, you cough up bloody mucus, or blood comes from your incision when you cough. This information is not intended to replace advice given to you by your health care provider. Make sure you discuss any questions you have with your health care provider. Document Revised: 08/31/2019 Document Reviewed: 08/31/2019 Elsevier Patient Education  2023 [employer].

## 2023-01-17 DIAGNOSIS — M1612 Unilateral primary osteoarthritis, left hip: Principal | ICD-10-CM | POA: Insufficient documentation

## 2023-01-27 MED ORDER — LACTATED RINGERS IV SOLN: INTRAVENOUS | Status: DC

## 2023-01-27 MED ORDER — ORAL CARE MOUTH RINSE: 15.0000 mL | Freq: Once | OROMUCOSAL | Status: AC

## 2023-01-27 MED ORDER — CHLORHEXIDINE GLUCONATE 4 % EX SOLN: 60.0000 mL | Freq: Once | CUTANEOUS | Status: DC

## 2023-01-27 MED ORDER — CHLORHEXIDINE GLUCONATE 0.12 % MT SOLN
15.0000 mL | Freq: Once | OROMUCOSAL | Status: AC
  Administered 2023-01-28: 15 mL via OROMUCOSAL

## 2023-01-27 MED ORDER — CELECOXIB 200 MG PO CAPS
400.0000 mg | ORAL_CAPSULE | Freq: Once | ORAL | Status: AC
  Administered 2023-01-28: 400 mg via ORAL

## 2023-01-27 MED ORDER — TRANEXAMIC ACID-NACL 1000-0.7 MG/100ML-% IV SOLN
1000.0000 mg | INTRAVENOUS | Status: AC
  Administered 2023-01-28: 1000 mg via INTRAVENOUS

## 2023-01-27 MED ORDER — GABAPENTIN 300 MG PO CAPS
300.0000 mg | ORAL_CAPSULE | Freq: Once | ORAL | Status: AC
  Administered 2023-01-28: 300 mg via ORAL

## 2023-01-27 MED ORDER — CEFAZOLIN SODIUM-DEXTROSE 2-4 GM/100ML-% IV SOLN
2.0000 g | INTRAVENOUS | Status: AC
  Administered 2023-01-28: 2 g via INTRAVENOUS

## 2023-01-27 MED ORDER — DEXAMETHASONE SODIUM PHOSPHATE 10 MG/ML IJ SOLN
8.0000 mg | Freq: Once | INTRAMUSCULAR | Status: AC
  Administered 2023-01-28: 8 mg via INTRAVENOUS

## 2023-01-28 ENCOUNTER — Observation Stay
Admission: RE | Admit: 2023-01-28 | Discharge: 2023-01-29 | Disposition: A | Discharge status: Home / Self Care | Payer: Medicare Other | Attending: Orthopedic Surgery | Admitting: Orthopedic Surgery

## 2023-01-28 ENCOUNTER — Other Ambulatory Visit: Payer: Self-pay

## 2023-01-28 ENCOUNTER — Observation Stay: Payer: Medicare Other

## 2023-01-28 ENCOUNTER — Encounter: Payer: Self-pay | Admitting: Orthopedic Surgery

## 2023-01-28 ENCOUNTER — Ambulatory Visit: Payer: Medicare Other | Admitting: Certified Registered Nurse Anesthetist

## 2023-01-28 ENCOUNTER — Ambulatory Visit: Payer: Medicare Other | Admitting: Urgent Care

## 2023-01-28 ENCOUNTER — Encounter
Admission: RE | Disposition: A | Discharge status: Home / Self Care | Payer: Self-pay | Source: Home / Self Care | Attending: Orthopedic Surgery

## 2023-01-28 DIAGNOSIS — Z853 Personal history of malignant neoplasm of breast: Secondary | ICD-10-CM | POA: Diagnosis not present

## 2023-01-28 DIAGNOSIS — Z79899 Other long term (current) drug therapy: Secondary | ICD-10-CM | POA: Diagnosis not present

## 2023-01-28 DIAGNOSIS — Z96652 Presence of left artificial knee joint: Secondary | ICD-10-CM | POA: Insufficient documentation

## 2023-01-28 DIAGNOSIS — Z96642 Presence of left artificial hip joint: Secondary | ICD-10-CM

## 2023-01-28 DIAGNOSIS — M1612 Unilateral primary osteoarthritis, left hip: Principal | ICD-10-CM | POA: Insufficient documentation

## 2023-01-28 DIAGNOSIS — I10 Essential (primary) hypertension: Secondary | ICD-10-CM | POA: Diagnosis not present

## 2023-01-28 DIAGNOSIS — D649 Anemia, unspecified: Secondary | ICD-10-CM

## 2023-01-28 HISTORY — PX: TOTAL HIP ARTHROPLASTY: SHX124

## 2023-01-28 SURGERY — ARTHROPLASTY, HIP, TOTAL,POSTERIOR APPROACH
Anesthesia: Spinal | Site: Hip | Laterality: Left

## 2023-01-28 MED ORDER — ACETAMINOPHEN 10 MG/ML IV SOLN
1000.0000 mg | Freq: Four times a day (QID) | INTRAVENOUS | Status: DC
  Administered 2023-01-28 – 2023-01-29 (×3): 1000 mg via INTRAVENOUS

## 2023-01-28 MED ORDER — MAGNESIUM HYDROXIDE 400 MG/5ML PO SUSP: 30.0000 mL | Freq: Every day | ORAL | Status: DC

## 2023-01-28 MED ORDER — PHENOL 1.4 % MT LIQD: 1.0000 | OROMUCOSAL | Status: DC | PRN

## 2023-01-28 MED ORDER — OXYCODONE HCL 5 MG PO TABS
ORAL_TABLET | ORAL | Status: AC
  Filled 2023-01-28: qty 1

## 2023-01-28 MED ORDER — PHENYLEPHRINE HCL-NACL 20-0.9 MG/250ML-% IV SOLN
INTRAVENOUS | Status: AC
  Filled 2023-01-28: qty 250

## 2023-01-28 MED ORDER — BUPIVACAINE HCL (PF) 0.5 % IJ SOLN
INTRAMUSCULAR | Status: AC
  Filled 2023-01-28: qty 10

## 2023-01-28 MED ORDER — OXYCODONE HCL 5 MG PO TABS: 10.0000 mg | ORAL_TABLET | ORAL | Status: DC | PRN

## 2023-01-28 MED ORDER — PROPOFOL 1000 MG/100ML IV EMUL
INTRAVENOUS | Status: AC
  Filled 2023-01-28: qty 100

## 2023-01-28 MED ORDER — PANTOPRAZOLE SODIUM 40 MG PO TBEC
DELAYED_RELEASE_TABLET | ORAL | Status: AC
  Filled 2023-01-28: qty 1

## 2023-01-28 MED ORDER — TRANEXAMIC ACID-NACL 1000-0.7 MG/100ML-% IV SOLN
INTRAVENOUS | Status: AC
  Filled 2023-01-28: qty 100

## 2023-01-28 MED ORDER — CEFAZOLIN SODIUM-DEXTROSE 2-4 GM/100ML-% IV SOLN
2.0000 g | Freq: Four times a day (QID) | INTRAVENOUS | Status: AC
  Administered 2023-01-28 (×2): 2 g via INTRAVENOUS

## 2023-01-28 MED ORDER — MIDAZOLAM HCL 5 MG/5ML IJ SOLN
INTRAMUSCULAR | Status: DC | PRN
  Administered 2023-01-28: .5 mg via INTRAVENOUS
  Administered 2023-01-28: 1.5 mg via INTRAVENOUS

## 2023-01-28 MED ORDER — ASPIRIN 81 MG PO CHEW
81.0000 mg | CHEWABLE_TABLET | Freq: Two times a day (BID) | ORAL | Status: DC
  Administered 2023-01-28 – 2023-01-29 (×2): 81 mg via ORAL

## 2023-01-28 MED ORDER — FLEET ENEMA 7-19 GM/118ML RE ENEM: 1.0000 | ENEMA | Freq: Once | RECTAL | Status: DC | PRN

## 2023-01-28 MED ORDER — HYDROMORPHONE HCL 1 MG/ML IJ SOLN: 0.5000 mg | INTRAMUSCULAR | Status: DC | PRN

## 2023-01-28 MED ORDER — PHENYLEPHRINE HCL-NACL 20-0.9 MG/250ML-% IV SOLN
INTRAVENOUS | Status: DC | PRN
  Administered 2023-01-28: 25 ug/min via INTRAVENOUS

## 2023-01-28 MED ORDER — CEFAZOLIN SODIUM-DEXTROSE 2-4 GM/100ML-% IV SOLN
INTRAVENOUS | Status: AC
  Filled 2023-01-28: qty 100

## 2023-01-28 MED ORDER — 0.9 % SODIUM CHLORIDE (POUR BTL) OPTIME
TOPICAL | Status: DC | PRN
  Administered 2023-01-28: 500 mL

## 2023-01-28 MED ORDER — CHLORHEXIDINE GLUCONATE 0.12 % MT SOLN
OROMUCOSAL | Status: AC
  Filled 2023-01-28: qty 15

## 2023-01-28 MED ORDER — CELECOXIB 200 MG PO CAPS
200.0000 mg | ORAL_CAPSULE | Freq: Two times a day (BID) | ORAL | Status: DC
  Administered 2023-01-29: 200 mg via ORAL

## 2023-01-28 MED ORDER — SODIUM CHLORIDE 0.9 % IR SOLN
Status: DC | PRN
  Administered 2023-01-28: 3000 mL

## 2023-01-28 MED ORDER — OXYCODONE HCL 5 MG PO TABS: 5.0000 mg | ORAL_TABLET | Freq: Once | ORAL | Status: DC | PRN

## 2023-01-28 MED ORDER — TRAMADOL HCL 50 MG PO TABS: 50.0000 mg | ORAL_TABLET | ORAL | Status: DC | PRN

## 2023-01-28 MED ORDER — OXYCODONE HCL 5 MG/5ML PO SOLN: 5.0000 mg | Freq: Once | ORAL | Status: DC | PRN

## 2023-01-28 MED ORDER — MIDAZOLAM HCL 2 MG/2ML IJ SOLN
INTRAMUSCULAR | Status: AC
  Filled 2023-01-28: qty 2

## 2023-01-28 MED ORDER — ASPIRIN 81 MG PO CHEW
CHEWABLE_TABLET | ORAL | Status: AC
  Filled 2023-01-28: qty 1

## 2023-01-28 MED ORDER — PROPOFOL 500 MG/50ML IV EMUL
INTRAVENOUS | Status: DC | PRN
  Administered 2023-01-28: 75 ug/kg/min via INTRAVENOUS

## 2023-01-28 MED ORDER — GLYCOPYRROLATE 0.2 MG/ML IJ SOLN
INTRAMUSCULAR | Status: AC
  Filled 2023-01-28: qty 1

## 2023-01-28 MED ORDER — DEXAMETHASONE SODIUM PHOSPHATE 10 MG/ML IJ SOLN
INTRAMUSCULAR | Status: AC
  Filled 2023-01-28: qty 1

## 2023-01-28 MED ORDER — METOCLOPRAMIDE HCL 10 MG PO TABS
ORAL_TABLET | ORAL | Status: AC
  Filled 2023-01-28: qty 1

## 2023-01-28 MED ORDER — CELECOXIB 200 MG PO CAPS
ORAL_CAPSULE | ORAL | Status: AC
  Filled 2023-01-28: qty 2

## 2023-01-28 MED ORDER — SENNOSIDES-DOCUSATE SODIUM 8.6-50 MG PO TABS
ORAL_TABLET | ORAL | Status: AC
  Filled 2023-01-28: qty 1

## 2023-01-28 MED ORDER — GABAPENTIN 300 MG PO CAPS
ORAL_CAPSULE | ORAL | Status: AC
  Filled 2023-01-28: qty 1

## 2023-01-28 MED ORDER — ONDANSETRON HCL 4 MG PO TABS: 4.0000 mg | ORAL_TABLET | Freq: Four times a day (QID) | ORAL | Status: DC | PRN

## 2023-01-28 MED ORDER — ACETAMINOPHEN 10 MG/ML IV SOLN
INTRAVENOUS | Status: AC
  Filled 2023-01-28: qty 100

## 2023-01-28 MED ORDER — OXYCODONE HCL 5 MG PO TABS
5.0000 mg | ORAL_TABLET | ORAL | Status: DC | PRN
  Administered 2023-01-28 – 2023-01-29 (×3): 5 mg via ORAL

## 2023-01-28 MED ORDER — STERILE WATER FOR IRRIGATION IR SOLN
Status: DC | PRN
  Administered 2023-01-28: 1000 mL

## 2023-01-28 MED ORDER — FENTANYL CITRATE (PF) 100 MCG/2ML IJ SOLN: 25.0000 ug | INTRAMUSCULAR | Status: DC | PRN

## 2023-01-28 MED ORDER — ONDANSETRON HCL 4 MG/2ML IJ SOLN: 4.0000 mg | Freq: Four times a day (QID) | INTRAMUSCULAR | Status: DC | PRN

## 2023-01-28 MED ORDER — FERROUS SULFATE 325 (65 FE) MG PO TABS
ORAL_TABLET | ORAL | Status: AC
  Filled 2023-01-28: qty 1

## 2023-01-28 MED ORDER — FERROUS SULFATE 325 (65 FE) MG PO TABS
325.0000 mg | ORAL_TABLET | Freq: Two times a day (BID) | ORAL | Status: DC
  Administered 2023-01-28 – 2023-01-29 (×2): 325 mg via ORAL

## 2023-01-28 MED ORDER — DIPHENHYDRAMINE HCL 12.5 MG/5ML PO ELIX: 12.5000 mg | ORAL_SOLUTION | ORAL | Status: DC | PRN

## 2023-01-28 MED ORDER — GABAPENTIN 100 MG PO CAPS
ORAL_CAPSULE | ORAL | Status: AC
  Filled 2023-01-28: qty 1

## 2023-01-28 MED ORDER — GABAPENTIN 100 MG PO CAPS
100.0000 mg | ORAL_CAPSULE | Freq: Every day | ORAL | Status: DC
  Administered 2023-01-28: 100 mg via ORAL

## 2023-01-28 MED ORDER — MENTHOL 3 MG MT LOZG: 1.0000 | LOZENGE | OROMUCOSAL | Status: DC | PRN

## 2023-01-28 MED ORDER — TRANEXAMIC ACID-NACL 1000-0.7 MG/100ML-% IV SOLN
1000.0000 mg | Freq: Once | INTRAVENOUS | Status: AC
  Administered 2023-01-28: 1000 mg via INTRAVENOUS

## 2023-01-28 MED ORDER — BISACODYL 10 MG RE SUPP: 10.0000 mg | Freq: Every day | RECTAL | Status: DC | PRN

## 2023-01-28 MED ORDER — IRBESARTAN 75 MG PO TABS
37.5000 mg | ORAL_TABLET | Freq: Every day | ORAL | Status: DC
  Administered 2023-01-28: 37.5 mg via ORAL
  Filled 2023-01-28 (×2): qty 0.5

## 2023-01-28 MED ORDER — PANTOPRAZOLE SODIUM 40 MG PO TBEC
40.0000 mg | DELAYED_RELEASE_TABLET | Freq: Two times a day (BID) | ORAL | Status: DC
  Administered 2023-01-28 – 2023-01-29 (×3): 40 mg via ORAL

## 2023-01-28 MED ORDER — ENSURE PRE-SURGERY PO LIQD
296.0000 mL | Freq: Once | ORAL | Status: AC
  Administered 2023-01-28: 296 mL via ORAL
  Filled 2023-01-28: qty 296

## 2023-01-28 MED ORDER — SENNOSIDES-DOCUSATE SODIUM 8.6-50 MG PO TABS
1.0000 | ORAL_TABLET | Freq: Two times a day (BID) | ORAL | Status: DC
  Administered 2023-01-28 – 2023-01-29 (×3): 1 via ORAL

## 2023-01-28 MED ORDER — BUPIVACAINE HCL (PF) 0.5 % IJ SOLN
INTRAMUSCULAR | Status: DC | PRN
  Administered 2023-01-28: 3 mL

## 2023-01-28 MED ORDER — METOCLOPRAMIDE HCL 10 MG PO TABS
10.0000 mg | ORAL_TABLET | Freq: Three times a day (TID) | ORAL | Status: DC
  Administered 2023-01-28 – 2023-01-29 (×3): 10 mg via ORAL

## 2023-01-28 MED ORDER — ACETAMINOPHEN 325 MG PO TABS: 325.0000 mg | ORAL_TABLET | Freq: Four times a day (QID) | ORAL | Status: DC | PRN

## 2023-01-28 MED ORDER — ACETAMINOPHEN 10 MG/ML IV SOLN
INTRAVENOUS | Status: DC | PRN
  Administered 2023-01-28: 1000 mg via INTRAVENOUS

## 2023-01-28 MED ORDER — SODIUM CHLORIDE 0.9 % IV SOLN: INTRAVENOUS | Status: DC

## 2023-01-28 MED ORDER — ALUM & MAG HYDROXIDE-SIMETH 200-200-20 MG/5ML PO SUSP: 30.0000 mL | ORAL | Status: DC | PRN

## 2023-01-28 MED ORDER — SURGIPHOR WOUND IRRIGATION SYSTEM - OPTIME
TOPICAL | Status: DC | PRN
  Administered 2023-01-28: 450 mL

## 2023-01-28 SURGICAL SUPPLY — 55 items
BLADE SAW 90X25X1.19 OSCILLAT (BLADE) ×1 IMPLANT
BRUSH SCRUB EZ PLAIN DRY (MISCELLANEOUS) ×1 IMPLANT
DRAPE 3/4 80X56 (DRAPES) ×1 IMPLANT
DRAPE INCISE IOBAN 66X60 STRL (DRAPES) ×1 IMPLANT
DRSG AQUACEL AG ADV 3.5X14 (GAUZE/BANDAGES/DRESSINGS) ×1 IMPLANT
DRSG MEPILEX SACRM 8.7X9.8 (GAUZE/BANDAGES/DRESSINGS) ×1 IMPLANT
DRSG NON-ADHERENT DERMACEA 3X4 (GAUZE/BANDAGES/DRESSINGS) ×1 IMPLANT
DRSG TEGADERM 4X4.75 (GAUZE/BANDAGES/DRESSINGS) ×1 IMPLANT
DURAPREP 26ML APPLICATOR (WOUND CARE) ×2 IMPLANT
ELECT CAUTERY BLADE 6.4 (BLADE) ×1 IMPLANT
ELECT REM PT RETURN 9FT ADLT (ELECTROSURGICAL) ×1
ELECTRODE REM PT RTRN 9FT ADLT (ELECTROSURGICAL) ×1 IMPLANT
GLOVE BIOGEL M STRL SZ7.5 (GLOVE) ×4 IMPLANT
GLOVE SRG 8 PF TXTR STRL LF DI (GLOVE) ×2 IMPLANT
GLOVE SURG UNDER POLY LF SZ8 (GLOVE) ×2
GOWN STRL REUS W/ TWL LRG LVL3 (GOWN DISPOSABLE) ×2 IMPLANT
GOWN STRL REUS W/ TWL XL LVL3 (GOWN DISPOSABLE) ×1 IMPLANT
GOWN STRL REUS W/TWL LRG LVL3 (GOWN DISPOSABLE) ×2
GOWN STRL REUS W/TWL XL LVL3 (GOWN DISPOSABLE) ×1
GOWN TOGA ZIPPER T7+ PEEL AWAY (MISCELLANEOUS) ×1 IMPLANT
HANDLE YANKAUER SUCT OPEN TIP (MISCELLANEOUS) ×1 IMPLANT
HEAD FEM STD 32X+1 STRL (Hips) IMPLANT
HEMOVAC 400CC 10FR (MISCELLANEOUS) ×1 IMPLANT
HOLDER FOLEY CATH W/STRAP (MISCELLANEOUS) ×1 IMPLANT
HOOD PEEL AWAY T7 (MISCELLANEOUS) ×1 IMPLANT
IV NS IRRIG 3000ML ARTHROMATIC (IV SOLUTION) ×1 IMPLANT
KIT PEG BOARD PINK (KITS) ×1 IMPLANT
KIT TURNOVER KIT A (KITS) ×1 IMPLANT
LINER MARATHON 10 DEG 32MMX450 (Hips) ×1 IMPLANT
LINER MARATHON 10D 32MMX450 (Hips) IMPLANT
MANIFOLD NEPTUNE II (INSTRUMENTS) ×2 IMPLANT
NS IRRIG 500ML POUR BTL (IV SOLUTION) ×1 IMPLANT
PACK HIP PROSTHESIS (MISCELLANEOUS) ×1 IMPLANT
PIN SECT CUP 50MM (Hips) IMPLANT
PULSAVAC PLUS IRRIG FAN TIP (DISPOSABLE) ×1
SOL .9 NS 3000ML IRR AL (IV SOLUTION)
SOL .9 NS 3000ML IRR UROMATIC (IV SOLUTION) IMPLANT
SOL PREP PVP 2OZ (MISCELLANEOUS) ×1
SOLUTION IRRIG SURGIPHOR (IV SOLUTION) ×1 IMPLANT
SOLUTION PREP PVP 2OZ (MISCELLANEOUS) ×1 IMPLANT
SPONGE DRAIN TRACH 4X4 STRL 2S (GAUZE/BANDAGES/DRESSINGS) ×1 IMPLANT
STAPLER SKIN PROX 35W (STAPLE) ×1 IMPLANT
STEM FEMORAL SZ 6MM STD ACTIS (Stem) IMPLANT
SUT ETHIBOND #5 BRAIDED 30INL (SUTURE) ×1 IMPLANT
SUT VIC AB 0 CT1 36 (SUTURE) ×2 IMPLANT
SUT VIC AB 1 CT1 36 (SUTURE) ×2 IMPLANT
SUT VIC AB 2-0 CT1 27 (SUTURE) ×1
SUT VIC AB 2-0 CT1 TAPERPNT 27 (SUTURE) ×1 IMPLANT
TAPE CLOTH 3X10 WHT NS LF (GAUZE/BANDAGES/DRESSINGS) ×1 IMPLANT
TIP FAN IRRIG PULSAVAC PLUS (DISPOSABLE) ×1 IMPLANT
TOWEL OR 17X26 4PK STRL BLUE (TOWEL DISPOSABLE) IMPLANT
TRAP FLUID SMOKE EVACUATOR (MISCELLANEOUS) ×1 IMPLANT
TRAY FOLEY MTR SLVR 16FR STAT (SET/KITS/TRAYS/PACK) ×1 IMPLANT
TUBING CONNECTING 10 (TUBING) ×2 IMPLANT
WATER STERILE IRR 1000ML POUR (IV SOLUTION) ×1 IMPLANT

## 2023-01-28 NOTE — Op Note (Signed)
OPERATIVE NOTE  DATE OF SURGERY:  01/28/2023  PATIENT NAME:  Debbie Chavez   DOB: August 25, 1950  MRN: [id number]  PRE-OPERATIVE DIAGNOSIS: Degenerative arthrosis of the left hip, primary  POST-OPERATIVE DIAGNOSIS:  Same  PROCEDURE:  Left total hip arthroplasty  SURGEON:  Jena Gauss. M.D.  ASSISTANT:  Gean Birchwood, PA-C (present and scrubbed throughout the case, critical for assistance with exposure, retraction, instrumentation, and closure)  ANESTHESIA: spinal  ESTIMATED BLOOD LOSS: 100 mL  FLUIDS REPLACED: 1400 mL of crystalloid  DRAINS: 2 medium Hemovac drains  IMPLANTS UTILIZED: DePuy size 6 standard offset Actis femoral stem, 50 mm OD Pinnacle 100 acetabular component, +4 mm 10 degree Pinnacle Marathon polyethylene insert, and a 32 mm CoCr +1 mm hip ball  INDICATIONS FOR SURGERY: Debbie Chavez is a 72 y.o. year old female with a long history of progressive hip and groin  pain. X-rays demonstrated severe degenerative changes. The patient had not seen any significant improvement despite conservative nonsurgical intervention. After discussion of the risks and benefits of surgical intervention, the patient expressed understanding of the risks benefits and agree with plans for total hip arthroplasty.   The risks, benefits, and alternatives were discussed at length including but not limited to the risks of infection, bleeding, nerve injury, stiffness, blood clots, the need for revision surgery, limb length inequality, dislocation, cardiopulmonary complications, among others, and they were willing to proceed.  PROCEDURE IN DETAIL: The patient was brought into the operating room and, after adequate spinal anesthesia was achieved, the patient was placed in a right lateral decubitus position. Axillary roll was placed and all bony prominences were well-padded. The patient's left hip was cleaned and prepped with alcohol and DuraPrep and draped in the usual sterile fashion. A "timeout" was  performed as per usual protocol. A lateral curvilinear incision was made gently curving towards the posterior superior iliac spine. The IT band was incised in line with the skin incision and the fibers of the gluteus maximus were split in line. The piriformis tendon was identified, skeletonized, and incised at its insertion to the proximal femur and reflected posteriorly. A T type posterior capsulotomy was performed. Prior to dislocation of the femoral head, a threaded Steinmann pin was inserted through a separate stab incision into the pelvis superior to the acetabulum and bent in the form of a stylus so as to assess limb length and hip offset throughout the procedure. The femoral head was then dislocated posteriorly. Inspection of the femoral head demonstrated severe degenerative changes with full-thickness loss of articular cartilage. The femoral neck cut was performed using an oscillating saw. The anterior capsule was elevated off of the femoral neck using a periosteal elevator. Attention was then directed to the acetabulum. The remnant of the labrum was excised using electrocautery. Inspection of the acetabulum also demonstrated significant degenerative changes. The acetabulum was reamed in sequential fashion up to a 49 mm diameter. Good punctate bleeding bone was encountered. A 50 mm Pinnacle 100 acetabular component was positioned and impacted into place. Good scratch fit was appreciated. A +4 mm neutral polyethylene trial was inserted.  Attention was then directed to the proximal femur.  Femoral broaches were inserted in a sequential fashion up to a size 6 broach. Calcar region was planed and a trial reduction was performed using a standard offset neck and a 32 mm hip ball with a +1 mm neck length.  Reasonably good stability was noted it was elected to trial with a +4 mm 10 degree  polyethylene trial of the high side position at the 4 o'clock position.  Good equalization of limb lengths and hip offset was  appreciated and excellent stability was noted both anteriorly and posteriorly. Trial components were removed. The acetabular shell was irrigated with copious amounts of normal saline with antibiotic solution and suctioned dry. A +4 mm 10 degree Pinnacle Marathon polyethylene insert was positioned with the high side at the 4 o'clock position and impacted into place. Next, a size 6 standard offset Actis femoral stem was positioned and impacted into place. Excellent scratch fit was appreciated. A trial reduction was again performed with a 32 mm hip ball with a +1 mm neck length. Again, good equalization of limb lengths was appreciated and excellent stability appreciated both anteriorly and posteriorly. The hip was then dislocated and the trial hip ball was removed. The Morse taper was cleaned and dried. A 32 mm cobalt chromium hip ball with a +1 mm neck length was placed on the trunnion and impacted into place. The hip was then reduced and placed through range of motion. Excellent stability was appreciated both anteriorly and posteriorly.  The wound was irrigated with copious amounts of normal saline followed by 450 ml of Surgiphor and suctioned dry. Good hemostasis was appreciated. The posterior capsulotomy was repaired using #5 Ethibond. Piriformis tendon was reapproximated to the undersurface of the gluteus medius tendon using #5 Ethibond. The IT band was reapproximated using interrupted sutures of #1 Vicryl. Subcutaneous tissue was approximated using first #0 Vicryl followed by #2-0 Vicryl. The skin was closed with skin staples.  The patient tolerated the procedure well and was transported to the recovery room in stable condition.   Jena Gauss., M.D.

## 2023-01-28 NOTE — Evaluation (Signed)
Physical Therapy Evaluation Patient Details Name: Debbie Chavez MRN: [id number] DOB: [birth year] Today's Date: 01/28/2023  History of Present Illness  Pt is a 72 y/o female s/p L THA on 01/28/2023 secondary to primary osteoarthritis of left hip. PMH includes: LBP w/ radiating symptoms, Anemia, Barrett's esophagus, breast cancer, HLD, vaginal prolapse, and L TKA 2021  Clinical Impression  Pt was pleasant and motivated to participate during the session and put forth good effort throughout. Pt initial session readings 97% SpO2 and HR 74 BPM. Pt denies any pain at start of session. Educated pt on posterior hip precautions and HEP packet, pt voiced understanding and was able to comply with precautions throughout session with minimal reminders from PT. Pt tolerated all exercises well with no immediate increases in pain. She was able to sit up to EOB with supervision and VC for proper sequencing and set up. Pt preformed STS from EOB to RW at min guard, only requiring cues for setup and LLE placement. Pt able to take 3-4 steps to transition to recliner, doing so with good eccentric control. Final session readings of 98% SpO2 and HR 89 BPM. Pt will benefit from continued PT services upon discharge to safely address deficits listed in patient problem list for decreased caregiver assistance and eventual return to PLOF.          If plan is discharge home, recommend the following: A little help with walking and/or transfers;A little help with bathing/dressing/bathroom;Assistance with cooking/housework;Assist for transportation;Help with stairs or ramp for entrance   Can travel by private vehicle        Equipment Recommendations None recommended by PT  Recommendations for Other Services       Functional Status Assessment Patient has had a recent decline in their functional status and demonstrates the ability to make significant improvements in function in a reasonable and predictable amount of time.      Precautions / Restrictions Precautions Precautions: Posterior Hip;Fall Precaution Booklet Issued: Yes (comment) Precaution Comments: Pt educated on post. hip precautions, pt understands; is compliant throughout session. Restrictions Weight Bearing Restrictions: Yes LLE Weight Bearing: Weight bearing as tolerated      Mobility  Bed Mobility Overal bed mobility: Needs Assistance Bed Mobility: Supine to Sit     Supine to sit: Supervision     General bed mobility comments: Pt cued for proper transition in accordance with post. hip precautions    Transfers Overall transfer level: Needs assistance Equipment used: Rolling walker (2 wheels) Transfers: Sit to/from Stand Sit to Stand: Min guard           General transfer comment: Pt cued for proper sequencing, hand positioning, and LLE positioning.    Ambulation/Gait Ambulation/Gait assistance: Min guard Gait Distance (Feet): 3 Feet Assistive device: Rolling walker (2 wheels)         General Gait Details: 3-4 steps taken from bed to recliner  Stairs            Wheelchair Mobility     Tilt Bed    Modified Rankin (Stroke Patients Only)       Balance Overall balance assessment: Needs assistance Sitting-balance support: Feet supported, Bilateral upper extremity supported Sitting balance-Leahy Scale: Fair Sitting balance - Comments: BUE supported on bed, pt leaning back slightly   Standing balance support: Reliant on assistive device for balance, Bilateral upper extremity supported Standing balance-Leahy Scale: Fair Standing balance comment: Static standing with RW  Pertinent Vitals/Pain Pain Assessment Pain Assessment: 0-10 Pain Score: 2  Pain Location: At surgical site/ L hip Pain Intervention(s): Monitored during session    Home Living Family/patient expects to be discharged to:: Private residence Living Arrangements: Alone Available Help at Discharge:  Family;Available 24 hours/day Type of Home: House Home Access: Stairs to enter Entrance Stairs-Rails: None (No hand rail, but has stable surface for support if needed) Entrance Stairs-Number of Steps: 3   Home Layout: One level Home Equipment: Shower seat - built Charity fundraiser (2 wheels);Cane - single point;BSC/3in1 Additional Comments: No grab bars installed, but pt voiced possibility for family to install grab bar if needed. Otherwise pt has stable surfaces she is able to utilize when needed.    Prior Function Prior Level of Function : Independent/Modified Independent             Mobility Comments: Able to go out in community, no limitations. Ambulating with SPC. ADLs Comments: Prior to worsening L hip pain, pt was able to complete ADL's without any issue.     Hand Dominance        Extremity/Trunk Assessment   Upper Extremity Assessment Upper Extremity Assessment: Overall WFL for tasks assessed    Lower Extremity Assessment Lower Extremity Assessment: Overall WFL for tasks assessed;Generalized weakness;LLE deficits/detail    Cervical / Trunk Assessment Cervical / Trunk Assessment: Normal  Communication   Communication: No difficulties  Cognition Arousal/Alertness: Awake/alert Behavior During Therapy: WFL for tasks assessed/performed Overall Cognitive Status: Within Functional Limits for tasks assessed                                 General Comments: Pt somewhat nervous/apprehensive; afraid of falling.        General Comments General comments (skin integrity, edema, etc.): Pt surgical site covered with dressing, no notable swelling or redness. Pt's hemovac maintained secure positioning and attachment throughout session.    Exercises Total Joint Exercises Ankle Circles/Pumps: AROM, Strengthening, Both, 10 reps, Supine Quad Sets: AROM, Strengthening, 10 reps, Both, Supine Gluteal Sets: AROM, Strengthening, 10 reps, Both, Supine Short Arc Quad:  AROM, Strengthening, 10 reps, Left, Supine Heel Slides: AROM, Strengthening, 10 reps, Left, Supine, AAROM Hip ABduction/ADduction: AROM, Strengthening, Both, 10 reps, Supine Long Arc Quad: AROM, Strengthening, Left, 10 reps, Seated   Assessment/Plan    PT Assessment Patient needs continued PT services  PT Problem List Decreased strength;Decreased range of motion;Decreased activity tolerance;Decreased balance;Decreased mobility;Pain       PT Treatment Interventions DME instruction;Therapeutic exercise;Gait training;Stair training;Functional mobility training;Therapeutic activities;Balance training;Neuromuscular re-education    PT Goals (Current goals can be found in the Care Plan section)  Acute Rehab PT Goals Patient Stated Goal: Get back to where I was a year ago. Walk without pain. PT Goal Formulation: With patient Time For Goal Achievement: 02/10/23 Potential to Achieve Goals: Good    Frequency BID     Co-evaluation               AM-PAC PT "6 Clicks" Mobility  Outcome Measure Help needed turning from your back to your side while in a flat bed without using bedrails?: A Little Help needed moving from lying on your back to sitting on the side of a flat bed without using bedrails?: A Little Help needed moving to and from a bed to a chair (including a wheelchair)?: A Little Help needed standing up from a chair using your arms (e.g., wheelchair or  bedside chair)?: A Little Help needed to walk in hospital room?: A Little Help needed climbing 3-5 steps with a railing? : A Little 6 Click Score: 18    End of Session Equipment Utilized During Treatment: Gait belt Activity Tolerance: Patient tolerated treatment well Patient left: in chair;with call bell/phone within reach;with family/visitor present. Pillows placed between pt's LE's to maintain post. hip precautions. Nurse Communication: Mobility status;Precautions PT Visit Diagnosis: Muscle weakness (generalized)  (M62.81);Other abnormalities of gait and mobility (R26.89);Unsteadiness on feet (R26.81)    Time: 9528-4132 PT Time Calculation (min) (ACUTE ONLY): 37 min   Charges:                 Cecile Sheerer, SPT 01/28/23, 5:05 PM

## 2023-01-28 NOTE — Interval H&P Note (Signed)
History and Physical Interval Note:  01/28/2023 6:27 AM  Debbie Chavez  has presented today for surgery, with the diagnosis of PRIMARY OSTEOARTHRITIS OF LEFT HIP.Marland Kitchen  The various methods of treatment have been discussed with the patient and family. After consideration of risks, benefits and other options for treatment, the patient has consented to  Procedure(s): TOTAL HIP ARTHROPLASTY (Left) as a surgical intervention.  The patient's history has been reviewed, patient examined, no change in status, stable for surgery.  I have reviewed the patient's chart and labs.  Questions were answered to the patient's satisfaction.     Debbie Chavez P Mikkel Charrette

## 2023-01-28 NOTE — Anesthesia Procedure Notes (Signed)
Spinal  Patient location during procedure: OR Start time: 01/28/2023 7:16 AM End time: 01/28/2023 7:23 AM Reason for block: surgical anesthesia Staffing Performed: resident/CRNA  Resident/CRNA: Malva Cogan, CRNA Performed by: Malva Cogan, CRNA Authorized by: Louie Boston, MD   Preanesthetic Checklist Completed: patient identified, IV checked, site marked, risks and benefits discussed, surgical consent, monitors and equipment checked, pre-op evaluation and timeout performed Spinal Block Patient position: sitting Prep: ChloraPrep Patient monitoring: heart rate, continuous pulse ox, blood pressure and cardiac monitor Approach: midline Location: L3-4 Injection technique: single-shot Needle Needle type: Whitacre and Introducer  Needle gauge: 25 G Needle length: 9 cm Assessment Sensory level: T10 Events: CSF return Additional Notes Negative paresthesia. Negative blood return. Positive free-flowing CSF. Expiration date of kit checked and confirmed. Patient tolerated procedure well, without complications.

## 2023-01-28 NOTE — Transfer of Care (Addendum)
Immediate Anesthesia Transfer of Care Note  Patient: Debbie Chavez  Procedure(s) Performed: TOTAL HIP ARTHROPLASTY (Left: Hip)  Patient Location: PACU  Anesthesia Type:Spinal  Level of Consciousness: awake and alert   Airway & Oxygen Therapy: Patient Spontanous Breathing and Patient connected to face mask oxygen  Post-op Assessment: Report given to RN and Post -op Vital signs reviewed and stable  Post vital signs: Reviewed and stable  Last Vitals:  Vitals Value Taken Time  BP 93/60 01/28/23 1021  Temp    Pulse 65 01/28/23 1024  Resp 18 01/28/23 1024  SpO2 100 % 01/28/23 1024  Vitals shown include unfiled device data.  Last Pain:  Vitals:   01/28/23 0619  TempSrc: Tympanic  PainSc: 5          Complications: No notable events documented.

## 2023-01-28 NOTE — Anesthesia Procedure Notes (Signed)
Date/Time: 01/28/2023 8:05 AM  Performed by: Malva Cogan, CRNAPre-anesthesia Checklist: Patient identified, Emergency Drugs available, Suction available, Patient being monitored and Timeout performed Patient Re-evaluated:Patient Re-evaluated prior to induction Oxygen Delivery Method: Simple face mask Induction Type: IV induction Placement Confirmation: CO2 detector and positive ETCO2

## 2023-01-28 NOTE — H&P (Signed)
ORTHOPAEDIC HISTORY & PHYSICAL Debbie Chavez, Debbie Mings., MD - 01/17/2023 8:45 AM EDT Formatting of this note is different from the original. Images from the original note were not included. Chief Complaint: Chief Complaint Patient presents with Lumbar spine MRI results H&P left total hip arthroplasty 01/28/23  Reason for Visit: The patient is a 72 y.o. female who presents today for reevaluation of her left hip. She has noticed progressive left hip and groin pain. The hip pain is worse with weight bearing and rotation of the hip. She has difficulty donning/doffing her shoes/stockings or getting into a car. The hip pain limits the patient's ability to ambulate long distances. The patient has not appreciated any significant improvement of the hip pain despite Tylenol, NSAIDs, and multiple intraarticular corticosteroid injections. She is using a cane for ambulation. The patient states that the hip pain has progressed to the point that it is significantly interfering with her activities of daily living.  Of note, she has had a several month history of lower back pain with radiation of pain down the posterior aspect of the leg to the level of the knee. She had some subjective weakness to the left leg without any gross numbness or causalgia. She denies any bowel/bladder dysfunction. She saw some transient improvement with a prednisone taper and physical therapy, but the symptoms have recurred.  Medications: Current Outpatient Medications Medication Sig Dispense Refill amoxicillin (AMOXIL) 500 MG capsule TAKE 4 CAPSULES ONE HOUR PRIOR TO DENTAL APPOINTMENT calcium carbonate-vitamin D3 (OS-CAL 500+D) 500 mg(1,250mg ) -400 unit tablet Take 1 tablet by mouth once daily. cholecalciferol (VITAMIN D3) 1000 unit capsule Take 1,000 Units by mouth once daily denosumab (PROLIA SUBQ) Inject subcutaneously Q 6 mo ferrous sulfate 325 (65 FE) MG tablet Take 325 mg by mouth Every 3 days gabapentin (NEURONTIN) 100 MG  capsule Take 1 capsule (100 mg total) by mouth at bedtime for 60 doses 60 capsule 1 multivitamin tablet Take 1 tablet by mouth once daily. omega-3 fatty acids/fish oil 340-1,000 mg capsule Take 1 capsule by mouth once daily pantoprazole (PROTONIX) 20 MG DR tablet Take 20 mg by mouth once daily Taking twice a day traMADoL (ULTRAM) 50 mg tablet Take 1 tablet (50 mg total) by mouth every 6 (six) hours as needed for Pain 90 tablet 1 valsartan (DIOVAN) 40 MG tablet Take 1 tablet (40 mg total) by mouth once daily 90 tablet 3  No current facility-administered medications for this visit.  Allergies: Allergies Allergen Reactions Lisinopril Hives Meperidine Other (See Comments) hypotension  Past Medical History: Past Medical History: Diagnosis Date Anemia Arthritis Arthritis involving multiple sites 12/04/2013 Barrett's esophagus 06/10/2018 Barrett's esophagus determined by endoscopy 09/2017 Benign neoplasm of ascending colon 10/25/2019 Breast cancer (CMS/HHS-HCC) Diverticulosis of large intestine without diverticulitis 10/25/2019 Gallstones Hiatal hernia 10/25/2019 Hyperlipidemia Hypertension Hypertension, essential, benign Postmenopausal osteoporosis 06/10/2018 Status post total left knee replacement 01/31/2020 Stress incontinence in female 01/22/2020 Vaginal prolapse 01/22/2020  Past Surgical History: Past Surgical History: Procedure Laterality Date ARTHROSCOPY SHOULDER Left 2008 MASTECTOMY BILATERAL SIMPLE 06/2007 Left total knee arthroplasty using computer-assisted navigation 12/14/2019 Dr Ernest Pine HYSTERECTOMY VAGINAL N/A 02/04/2020 Procedure: VAGINAL HYSTERECTOMY, FOR UTERUS 250 G OR LESS; Surgeon: Antonietta Barcelona, MD; Location: [facility] OR; Service: Gynecology; Laterality: N/A; COLPOPEXY FOR SUSPENSION UTEROSACRUM INTRAPERITONEAL N/A 02/04/2020 Procedure: COLPOPEXY, VAGINAL; INTRA-PERITONEAL APPROACH (UTEROSACRAL, LEVATOR MYORRHAPHY); Surgeon: Antonietta Barcelona, MD; Location:  [facility] OR; Service: Gynecology; Laterality: N/A; COLPORRHAPHY FOR REPAIR CYSTOCELE ANTERIOR N/A 02/04/2020 Procedure: ANTERIOR COLPORRHAPHY, REPAIR OF CYSTOCELE WITH OR WITHOUT REPAIR OF URETHROCELE,  INCLUDING CYSTOURETHROSCOPY, WHEN PERFORMED; Surgeon: Antonietta Barcelona, MD; Location: [facility] OR; Service: Gynecology; Laterality: N/A; CYSTOURETHROSCOPY N/A 02/04/2020 Procedure: CYSTOURETHROSCOPY (SEPARATE PROCEDURE); Surgeon: Antonietta Barcelona, MD; Location: [facility] OR; Service: Gynecology; Laterality: N/A; SLING FOR STRESS INCONTINENCE N/A 02/04/2020 Procedure: SLING OPERATION FOR STRESS INCONTINENCE (EG, FASCIA OR SYNTHETIC); Surgeon: Antonietta Barcelona, MD; Location: [facility] OR; Service: Gynecology; Laterality: N/A; CATARACT EXTRACTION Bilateral 12/2021 COLONOSCOPY MASTECTOMY PARTIAL / LUMPECTOMY TUBAL LIGATION  Social History: Social History  Socioeconomic History Marital status: Widowed Spouse name: Debbie Chavez Number of children: 3 Years of education: 13 Occupational History Occupation: Retired- Chief Executive Officer- [employer] Tobacco Use Smoking status: Never Smokeless tobacco: Never Vaping Use Vaping status: Never Used Substance and Sexual Activity Alcohol use: Not Currently Drug use: No Sexual activity: Yes Partners: Male  Family History: Family History Problem Relation Name Age of Onset Breast cancer Other High blood pressure (Hypertension) Other Breast cancer Mother Breast cancer Sister Breast cancer Sister Anesthesia problems Neg Hx  Review of Systems: A comprehensive 14 point ROS was performed, reviewed, and the pertinent orthopaedic findings are documented in the HPI.  Exam BP 128/78  Ht 159.8 cm (5' 2.9")  Wt 78.9 kg (174 lb)  BMI 30.92 kg/m  General: Well-developed, well-nourished female seen in no acute distress. Antalgic gait without evidence of significant abductor lurch. No varus or valgus thrust to the left knee.  HEENT: Atraumatic,  normocephalic. Pupils are equal and reactive to light. Extraocular motion is intact. Sclera are clear. Oropharynx is clear with moist mucosa.  Neck: Supple, nontender, and with good ROM. No thyromegaly, adenopathy, JVD, or carotid bruits.  Lungs: Clear to auscultation bilaterally.  Cardiovascular: Regular rate and rhythm. Normal S1, S2. No murmur . No appreciable gallops or rubs. Peripheral pulses are palpable. No lower extremity edema. Homan`s test is negative.  Abdomen: Soft, nontender, nondistended. Bowel sounds are present.  Spine: Alignment: No gross scoliosis. Normal lumbar lordosis. Sacroiliac joints: Nontender to palpation Patrick`s test: Negative Flip test: Negative Tenderness: Moderate to the lower lumbar area Paraspinous spasm: Mild Range of motion: Guarded range of motion with both flexion and extension  Extremities: Good strength, stability, and range of motion of the upper extremities.  Left Hip: Pelvic tilt: Negative Limb lengths: Equal with the patient standing Soft tissue swelling: Negative Erythema: Negative Crepitance: Negative Tenderness: Greater trochanter is nontender to palpation. Severe pain is elicited by axial compression or extremes of rotation. Atrophy: No atrophy. Fair to good hip flexor and abductor strength. Range of Motion: EXT/FLEX: 0/0/100 ADD/ABD: 20/0/20 IR/ER: 0/0/20  Vascular: Peripheral pulses are palpable. Good capillary refill. No gross pretibial or ankle edema. Homans test is negative.  Neurologic: Awake, alert, and oriented. Sensory function is intact to pinprick and light touch. Motor strength is judged to be 5/5. Motor coordination is grossly within normal limits. No apparent clonus. No tremor. Deep tendon reflexes are symmetric.  Lumbar MRI: I reviewed the lumbar MRI performed at [facility] on 01/07/2023. I concur with the radiologist's interpretation as below:  MRI LUMBAR SPINE WITHOUT  CONTRAST  TECHNIQUE: Multiplanar, multisequence MR imaging of the lumbar spine was performed. No intravenous contrast was administered.  COMPARISON: None Available.  FINDINGS: Segmentation: Standard.  Alignment: Mild degenerative anterolisthesis at L4-5 and L5-S1. Mild dextroscoliosis.  Vertebrae: No fracture, evidence of discitis, or bone lesion.  Conus medullaris and cauda equina: Conus extends to the L1 level. Conus and cauda equina appear normal.  Paraspinal and other soft tissues: Negative for perispinal mass or inflammation.  Disc levels:  T12- L1: Unremarkable.  L1-L2: Mild disc desiccation and narrowing  L2-L3: Small biforaminal protrusion. Mild degenerative facet spurring on both sides.  L3-L4: The disc is narrowed and bulging with degenerative facet spurring asymmetric to the left.  L4-L5: Degenerative facet spurring which is moderate with small joint effusions and mild anterolisthesis. Mild disc height loss and bulge. Mild subarticular recess narrowing on both sides.  L5-S1:Degenerative facet spurring asymmetric to the right. The disc is bulging with small right foraminal protrusion.  IMPRESSION: 1. Generalized lumbar spine degeneration with mild scoliosis and L4-5, L5-S1 anterolisthesis. 2. Up to mild foraminal narrowing on the right at L5-S1. Diffusely patent spinal canal.  Electronically Signed By: Tiburcio Pea M.D. 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  We discussed the contribution of the hip arthritis and the lumbar radiculitis to her symptoms. Although she does not demonstrate a focal neurologic deficit, her radicular symptoms have not responded to conservative treatment including prednisone, Tylenol, NSAIDs, and physical therapy. We discussed the  option of epidural steroid injections, but we will defer such measures until after her hip surgery.  The patient was given informational material on total hip replacement. Conservative treatment options were reviewed with the patient. We discussed the risks and benefits of surgical intervention. The usual perioperative course was also discussed in detail. The patient expressed understanding of the risks and benefits of surgical intervention and would like to proceed with plans for left total hip arthroplasty.  I spent a total of 45 minutes in both face-to-face and non-face-to-face activities, excluding procedures performed, for this visit on the date of this encounter.  SPECIAL TESTS: None MEDICAL CLEARANCE: Per anesthesiology ACTIVITIES: As tolerated. WORK STATUS: Not applicable. THERAPY: Preoperative physical therapy evaluation. MEDICATIONS: Requested Prescriptions  No prescriptions requested or ordered in this encounter  FOLLOW-UP: Return for postoperative follow-up.  Lazaro Isenhower P. Angie Fava., M.D.  This note was generated in part with voice recognition software and I apologize for any typographical errors that were not detected and corrected. Electronically signed by Shari Heritage., MD at 01/17/2023 11:20 PM EDT

## 2023-01-28 NOTE — Anesthesia Preprocedure Evaluation (Addendum)
Anesthesia Evaluation  Patient identified by MRN, date of birth, ID band Patient awake    Reviewed: Allergy & Precautions, NPO status , Patient's Chart, lab work & pertinent test results  History of Anesthesia Complications Negative for: history of anesthetic complications  Airway Mallampati: II  TM Distance: >3 FB Neck ROM: full    Dental no notable dental hx.    Pulmonary neg pulmonary ROS   Pulmonary exam normal        Cardiovascular Exercise Tolerance: Good hypertension, On Medications Normal cardiovascular exam     Neuro/Psych  Neuromuscular disease  negative psych ROS   GI/Hepatic Neg liver ROS, hiatal hernia,GERD  Medicated and Controlled,,  Endo/Other  negative endocrine ROS    Renal/GU Renal disease     Musculoskeletal   Abdominal   Peds  Hematology  (+) Blood dyscrasia, anemia   Anesthesia Other Findings Past Medical History: No date: Acute left-sided low back pain with left-sided sciatica No date: Age-related osteoporosis without current pathological  fracture No date: Anemia No date: Arthritis     Comment:  hands, knees No date: Barrett's esophagus without dysplasia No date: Benign neoplasm of ascending colon 06/2007: Cancer (HCC)     Comment:  right breast  No date: Complication of anesthesia     Comment:  bp dropped with demerol No date: DDD (degenerative disc disease), lumbar No date: Diverticulosis of large intestine without diverticulitis No date: Family history of adverse reaction to anesthesia     Comment:  sister BP drops No date: Gallstones No date: GERD (gastroesophageal reflux disease) No date: History of hiatal hernia No date: Hyperlipidemia No date: Hypertension No date: Primary osteoarthritis of left hip No date: Pure hypercholesterolemia No date: Situational stress No date: Stage 3a chronic kidney disease (CKD) (HCC) No date: Stress incontinence in female No date: Vaginal  prolapse  Past Surgical History: 2007: BREAST SURGERY; Right     Comment:  lumpectomy 01/08/2022: CATARACT EXTRACTION W/PHACO; Left     Comment:  Procedure: CATARACT EXTRACTION PHACO AND INTRAOCULAR               LENS PLACEMENT (IOC) LEFT;  Surgeon: Nevada Crane,               MD;  Location: [facility];  Service:               Ophthalmology;  Laterality: Left;  3.33 00:25.8 01/22/2022: CATARACT EXTRACTION W/PHACO; Right     Comment:  Procedure: CATARACT EXTRACTION PHACO AND INTRAOCULAR               LENS PLACEMENT (IOC) RIGHT;  Surgeon: Nevada Crane,              MD;  Location: [facility];  Service:               Ophthalmology;  Laterality: Right;  3.58 00:26.9 No date: COLONOSCOPY 08/20/2017: COLONOSCOPY WITH PROPOFOL; N/A     Comment:  Procedure: COLONOSCOPY WITH PROPOFOL;  Surgeon:               Pasty Spillers, MD;  Location: [facility];              Service: Endoscopy;  Laterality: N/A; 09/18/2022: COLONOSCOPY WITH PROPOFOL; N/A     Comment:  Procedure: COLONOSCOPY WITH PROPOFOL;  Surgeon: Toney Reil, MD;  Location: [facility];  Service: Endoscopy;  Laterality: N/A; No date: COLPOPEXY FOR SUSPENSION UTEROSACRUM INTRAPERITONEAL N/A 08/ 05/2020 No date: COLPORRHAPHY FOR REPAIR CYSTOCELE ANTERIOR N/A 02/04/2020 No date: CYSTOURETHROSCOPY 08/20/2017: ESOPHAGOGASTRODUODENOSCOPY (EGD) WITH PROPOFOL; N/A     Comment:  Procedure: ESOPHAGOGASTRODUODENOSCOPY (EGD) WITH               PROPOFOL;  Surgeon: Pasty Spillers, MD;  Location:               [facility];  Service: Endoscopy;  Laterality:               N/A; 02/12/2018: ESOPHAGOGASTRODUODENOSCOPY (EGD) WITH PROPOFOL; N/A     Comment:  Procedure: ESOPHAGOGASTRODUODENOSCOPY (EGD) WITH               PROPOFOL;  Surgeon: Pasty Spillers, MD;  Location:               [facility];  Service: Endoscopy;  Laterality:  N/A; 09/14/2019: ESOPHAGOGASTRODUODENOSCOPY (EGD) WITH PROPOFOL; N/A     Comment:  Procedure: ESOPHAGOGASTRODUODENOSCOPY (EGD) WITH BIOPSY;              Surgeon: Midge Minium, MD;  Location: [facility]               [facility];  Service: Endoscopy;  Laterality: N/A; 09/18/2022: ESOPHAGOGASTRODUODENOSCOPY (EGD) WITH PROPOFOL; N/A     Comment:  Procedure: ESOPHAGOGASTRODUODENOSCOPY (EGD) WITH               PROPOFOL;  Surgeon: Toney Reil, MD;  Location:               [facility];  Service: Endoscopy;  Laterality:               N/A; No date: JOINT REPLACEMENT 12/14/2019: KNEE ARTHROPLASTY; Left     Comment:  Procedure: COMPUTER ASSISTED TOTAL KNEE ARTHROPLASTY;                Surgeon: Donato Heinz, MD;  Location: [facility];                Service: Orthopedics;  Laterality: Left; 06/2007: MASTECTOMY; Bilateral     Comment:  no lumph nodes removed 08/20/2017: POLYPECTOMY     Comment:  Procedure: POLYPECTOMY;  Surgeon: Pasty Spillers,               MD;  Location: [facility];  Service: Endoscopy;; 09/18/2022: POLYPECTOMY     Comment:  Procedure: POLYPECTOMY;  Surgeon: Toney Reil,               MD;  Location: [facility];  Service: Endoscopy;; No date: shoulder; Left No date: TUBAL LIGATION No date: VAGINAL HYSTERECTOMY  BMI    Body Mass Index: 31.18 kg/m      Reproductive/Obstetrics negative OB ROS                             Anesthesia Physical Anesthesia Plan  ASA: 2  Anesthesia Plan: Spinal   Post-op Pain Management: Regional block*, Celebrex PO (pre-op)* and Gabapentin PO (pre-op)*   Induction: Intravenous  PONV Risk Score and Plan: 2 and TIVA and Propofol infusion  Airway Management Planned: Natural Airway and Nasal Cannula  Additional Equipment:   Intra-op Plan:   Post-operative Plan:   Informed Consent: I have reviewed the patients History and Physical, chart, labs and discussed the  procedure including the risks, benefits and alternatives for the  proposed anesthesia with the patient or authorized representative who has indicated his/her understanding and acceptance.     Dental Advisory Given  Plan Discussed with: Anesthesiologist, CRNA and Surgeon  Anesthesia Plan Comments: (Patient reports no bleeding problems and no anticoagulant use.  Plan for spinal with backup GA  Patient consented for risks of anesthesia including but not limited to:  - adverse reactions to medications - damage to eyes, teeth, lips or other oral mucosa - nerve damage due to positioning  - risk of bleeding, infection and or nerve damage from spinal that could lead to paralysis - risk of headache or failed spinal - damage to teeth, lips or other oral mucosa - sore throat or hoarseness - damage to heart, brain, nerves, lungs, other parts of body or loss of life  Patient voiced understanding.)        Anesthesia Quick Evaluation

## 2023-01-29 DIAGNOSIS — M1612 Unilateral primary osteoarthritis, left hip: Secondary | ICD-10-CM | POA: Diagnosis not present

## 2023-01-29 MED ORDER — METOCLOPRAMIDE HCL 10 MG PO TABS
ORAL_TABLET | ORAL | Status: AC
  Filled 2023-01-29: qty 1

## 2023-01-29 MED ORDER — ASPIRIN 81 MG PO CHEW
CHEWABLE_TABLET | ORAL | Status: AC
  Filled 2023-01-29: qty 1

## 2023-01-29 MED ORDER — TRAMADOL HCL 50 MG PO TABS: 50.0000 mg | ORAL_TABLET | Freq: Four times a day (QID) | ORAL | 0 refills | Status: AC | PRN

## 2023-01-29 MED ORDER — CELECOXIB 200 MG PO CAPS: 200.0000 mg | ORAL_CAPSULE | Freq: Two times a day (BID) | ORAL | 1 refills | Status: DC

## 2023-01-29 MED ORDER — OXYCODONE HCL 5 MG PO TABS
ORAL_TABLET | ORAL | Status: AC
  Filled 2023-01-29: qty 1

## 2023-01-29 MED ORDER — FERROUS SULFATE 325 (65 FE) MG PO TABS
ORAL_TABLET | ORAL | Status: AC
  Filled 2023-01-29: qty 1

## 2023-01-29 MED ORDER — CELECOXIB 200 MG PO CAPS
ORAL_CAPSULE | ORAL | Status: AC
  Filled 2023-01-29: qty 1

## 2023-01-29 MED ORDER — PANTOPRAZOLE SODIUM 40 MG PO TBEC
DELAYED_RELEASE_TABLET | ORAL | Status: AC
  Filled 2023-01-29: qty 1

## 2023-01-29 MED ORDER — ACETAMINOPHEN 10 MG/ML IV SOLN
INTRAVENOUS | Status: AC
  Filled 2023-01-29: qty 100

## 2023-01-29 MED ORDER — ASPIRIN 81 MG PO CHEW: 81.0000 mg | CHEWABLE_TABLET | Freq: Two times a day (BID) | ORAL | Status: AC

## 2023-01-29 MED ORDER — OXYCODONE HCL 5 MG PO TABS: 5.0000 mg | ORAL_TABLET | ORAL | 0 refills | Status: DC | PRN

## 2023-01-29 MED ORDER — SENNOSIDES-DOCUSATE SODIUM 8.6-50 MG PO TABS
ORAL_TABLET | ORAL | Status: AC
  Filled 2023-01-29: qty 1

## 2023-01-29 MED ORDER — MAGNESIUM HYDROXIDE 400 MG/5ML PO SUSP
ORAL | Status: AC
  Filled 2023-01-29: qty 30

## 2023-01-29 NOTE — Anesthesia Postprocedure Evaluation (Signed)
Anesthesia Post Note  Patient: Debbie Chavez  Procedure(s) Performed: TOTAL HIP ARTHROPLASTY (Left: Hip)  Patient location during evaluation: Nursing Unit Anesthesia Type: Spinal Level of consciousness: oriented and awake and alert Pain management: pain level controlled Vital Signs Assessment: post-procedure vital signs reviewed and stable Respiratory status: spontaneous breathing and respiratory function stable Cardiovascular status: blood pressure returned to baseline and stable Postop Assessment: no headache, no backache, no apparent nausea or vomiting and patient able to bend at knees Anesthetic complications: no   No notable events documented.   Last Vitals:  Vitals:   01/28/23 2333 01/29/23 0400  BP: (!) 105/50 (!) 100/54  Pulse: 67 62  Resp: 16 16  Temp: 36.7 C 36.6 C  SpO2: 95% 96%    Last Pain:  Vitals:   01/29/23 0410  TempSrc:   PainSc: 2                  Rosanne Gutting

## 2023-01-29 NOTE — Plan of Care (Signed)
  Problem: Activity: Goal: Ability to avoid complications of mobility impairment will improve Outcome: Progressing   Problem: Pain Management: Goal: Pain level will decrease with appropriate interventions Outcome: Progressing   Problem: Skin Integrity: Goal: Will show signs of wound healing Outcome: Progressing   

## 2023-01-29 NOTE — Progress Notes (Signed)
Pts BP 107/52, MD Hooten notified. Per MD Hooten hold Irbesartan this morning.

## 2023-01-29 NOTE — Progress Notes (Signed)
DISCHARGE NOTE:  Pt given discharge instructions and scripts. TED hose on both legs. 2 honeycomb dressings sent with pt. Pt wheeled to car by staff, son providing transportation.

## 2023-01-29 NOTE — TOC Progression Note (Signed)
Transition of Care [facility]) - Progression Note    Patient Details  Name: Debbie Chavez MRN: [id number] Date of Birth: 1950-08-18  Transition of [facility]) CM/SW Contact  Marlowe Sax, RN Phone Number: 01/29/2023, 8:48 AM  Clinical Narrative:     The patient is [facility] with [facility] serives prior to Admission from Surgeons office She has built [employer] (2 wheels);Cane - single point;BSC/3in1 at home and does not need addiitonal DME  Expected Discharge Plan: Home w Home Health Services Barriers to Discharge: No Barriers Identified  Expected Discharge Plan and Services   Discharge Planning Services: CM Consult   Living arrangements for the past 2 months: Single Family Home Expected Discharge Date: 01/29/23               DME Arranged: N/A DME Agency: NA       HH Arranged: PT, OT HH Agency: [facility] Date HH Agency Contacted: 01/29/23 Time HH Agency Contacted: 0848 Representative spoke with at [facility] Agency: [place]   Social Determinants of Health (SDOH) Interventions SDOH Screenings   Food Insecurity: No Food Insecurity (01/28/2023)  Housing: Low Risk  (01/28/2023)  Transportation Needs: No Transportation Needs (01/28/2023)  Utilities: Not At Risk (01/28/2023)  Tobacco Use: Low Risk  (01/28/2023)    Readmission Risk Interventions     No data to display

## 2023-01-29 NOTE — Evaluation (Signed)
Occupational Therapy Evaluation Patient Details Name: Debbie Chavez MRN: [id number] DOB: [birth year] Today's Date: 01/29/2023   History of Present Illness Pt is a 72 y/o female s/p L THA on 01/28/2023 secondary to primary osteoarthritis of left hip. PMH includes: LBP w/ radiating symptoms, Anemia, Barrett's esophagus, breast cancer, HLD, vaginal prolapse, and L TKA 2021   Clinical Impression   Pt seen for OT evaluation this date, POD#1 from above surgery. Pt was independent in all ADL prior to surgery, however limited by pain and using SPC for mobility. Pt is eager to return to PLOF with less pain and improved safety and independence. Pt currently requires MOD-MAX A for compression stockings and PRN MIN A for LB dressing and bathing while in seated position due to pain and limited AROM of L hip. Pt able to recall 2/3 posterior total hip precautions at start of session and unable to verbalize how to implement during ADL and mobility. Pt instructed in posterior total hip precautions and how to implement, self care skills, falls prevention strategies, home/routines modifications, DME/AE for LB bathing and dressing tasks, ADL mobility, and compression stocking mgt strategies. At end of session, pt verbalizing understanding of 3/3 posterior total hip precautions and how to maintain. Handout provided to support recall and carryover. Pt would benefit from additional instruction upon discharge in self care skills and techniques to help maintain precautions with or without assistive devices to support recall and carryover prior to discharge.      Recommendations for follow up therapy are one component of a multi-disciplinary discharge planning process, led by the attending physician.  Recommendations may be updated based on patient status, additional functional criteria and insurance authorization.   Assistance Recommended at Discharge PRN  Patient can return home with the following A little help with walking  and/or transfers;Assistance with cooking/housework;Assist for transportation;Help with stairs or ramp for entrance    Functional Status Assessment  Patient has had a recent decline in their functional status and demonstrates the ability to make significant improvements in function in a reasonable and predictable amount of time.  Equipment Recommendations  None recommended by OT    Recommendations for Other Services       Precautions / Restrictions Precautions Precautions: Posterior Hip;Fall Precaution Booklet Issued: Yes (comment) Precaution Comments: Pt able to recall 3/3 precautions by end of session and able to verbalize how to maintain Restrictions Weight Bearing Restrictions: Yes LLE Weight Bearing: Weight bearing as tolerated      Mobility Bed Mobility Overal bed mobility: Needs Assistance Bed Mobility: Supine to Sit     Supine to sit: Supervision     General bed mobility comments: good carry over of strategies to maintain precautions during transitional movement    Transfers Overall transfer level: Needs assistance Equipment used: Rolling walker (2 wheels) Transfers: Sit to/from Stand Sit to Stand: Supervision           General transfer comment: 1 VC for foot placement and good carryover of learned hand positioning      Balance Overall balance assessment: Needs assistance Sitting-balance support: No upper extremity supported, Feet supported Sitting balance-Leahy Scale: Good     Standing balance support: Reliant on assistive device for balance, Bilateral upper extremity supported Standing balance-Leahy Scale: Fair                             ADL either performed or assessed with clinical judgement   ADL  General ADL Comments: Pt currently requires MIN A For LB ADL Tasks, Supv for ADL mobility with RW requiring PRN VC for hand/foot placement, and MOD-MAX A For compression stocking mgt  which pt reports family can assist with.     Vision         Perception     Praxis      Pertinent Vitals/Pain Pain Assessment Pain Assessment: 0-10 Pain Score: 2  Pain Location: At surgical site/ L hip Pain Descriptors / Indicators: Aching Pain Intervention(s): Limited activity within patient's tolerance, Monitored during session, Premedicated before session, Repositioned, Ice applied     Hand Dominance     Extremity/Trunk Assessment Upper Extremity Assessment Upper Extremity Assessment: Overall WFL for tasks assessed   Lower Extremity Assessment Lower Extremity Assessment: LLE deficits/detail LLE Deficits / Details: anticipated post-op strength/ROM deficits   Cervical / Trunk Assessment Cervical / Trunk Assessment: Normal   Communication Communication Communication: No difficulties   Cognition Arousal/Alertness: Awake/alert Behavior During Therapy: WFL for tasks assessed/performed Overall Cognitive Status: Within Functional Limits for tasks assessed                                       General Comments       Exercises Other Exercises Other Exercises: Pt educated in home/routines modications, falls prevention, AE/DME, and how to maintain hip precautions during ADL/mobility. Handout provided to support recall and carryover.   Shoulder Instructions      Home Living Family/patient expects to be discharged to:: Private residence Living Arrangements: Alone Available Help at Discharge: Family;Available 24 hours/day Type of Home: House Home Access: Stairs to enter [employer] of Steps: 3 Entrance Stairs-Rails: None (No hand rail, but has stable surface for support if needed) Home Layout: One level     Bathroom Shower/Tub: Producer, television/film/video: Standard Bathroom Accessibility: Yes   Home Equipment: Shower seat - built [employer] (2 wheels);Cane - single point;BSC/3in1;Adaptive equipment Adaptive Equipment:  Reacher;Long-handled sponge Additional Comments: No grab bars installed, but pt voiced possibility for family to install grab bar if needed. Otherwise pt has stable surfaces she is able to utilize when needed.      Prior Functioning/Environment Prior Level of Function : Independent/Modified Independent             Mobility Comments: Able to go out in community, no limitations. Ambulaitng with SPC ADLs Comments: Prior to worsening L hip pain, pt was able to complete ADL's without any issue.        OT Problem List: Decreased strength;Decreased range of motion;Pain;Decreased knowledge of use of DME or AE;Decreased knowledge of precautions      OT Treatment/Interventions:      OT Goals(Current goals can be found in the care plan section) Acute Rehab OT Goals Patient Stated Goal: be independent OT Goal Formulation: All assessment and education complete, DC therapy  OT Frequency:      Co-evaluation              AM-PAC OT "6 Clicks" Daily Activity     Outcome Measure Help from another person eating meals?: None Help from another person taking care of personal grooming?: None Help from another person toileting, which includes using toliet, bedpan, or urinal?: A Little Help from another person bathing (including washing, rinsing, drying)?: A Little Help from another person to put on and taking off regular upper body clothing?: None Help from  another person to put on and taking off regular lower body clothing?: A Little 6 Click Score: 21   End of Session Equipment Utilized During Treatment: Rolling walker (2 wheels)  Activity Tolerance: Patient tolerated treatment well Patient left: in chair;with call bell/phone within reach  OT Visit Diagnosis: Pain Pain - Right/Left: Left Pain - part of body: Hip                Time: 1610-9604 OT Time Calculation (min): 21 min Charges:  OT General Charges $OT Visit: 1 Visit OT Evaluation $OT Eval Low Complexity: 1 Low OT  Treatments $Self Care/Home Management : 8-22 mins  Arman Filter., MPH, MS, OTR/L ascom [phone] 01/29/23, 10:06 AM

## 2023-01-29 NOTE — Progress Notes (Signed)
Subjective: 1 Day Post-Op Procedure(s) (LRB): TOTAL HIP ARTHROPLASTY (Left) Patient reports pain as mild.   Patient seen in rounds with Dr. Ernest Pine. Patient is well, and has had no acute complaints or problems.  Denies having any CP, SOB, N/V, fevers or chills. We will continue with therapy today.  States that she was already up and able to move to her chair and ambulate around her room with PT yesterday. Plan is to go Home after hospital stay.  Objective: Vital signs in last 24 hours: Temp:  [96.8 F (36 C)-98.6 F (37 C)] 98.6 F (37 C) (08/06 0732) Pulse Rate:  [56-70] 67 (08/06 0732) Resp:  [11-20] 14 (08/06 0732) BP: (93-116)/(50-89) 110/56 (08/06 0732) SpO2:  [95 %-100 %] 98 % (08/06 0732)  Intake/Output from previous day:  Intake/Output Summary (Last 24 hours) at 01/29/2023 0743 Last data filed at 01/29/2023 0500 Gross per 24 hour  Intake 2176.39 ml  Output 360 ml  Net 1816.39 ml    Intake/Output this shift: No intake/output data recorded.  Labs: No results for input(s): "HGB" in the last 72 hours. No results for input(s): "WBC", "RBC", "HCT", "PLT" in the last 72 hours. No results for input(s): "NA", "K", "CL", "CO2", "BUN", "CREATININE", "GLUCOSE", "CALCIUM" in the last 72 hours. No results for input(s): "LABPT", "INR" in the last 72 hours.  EXAM General - Patient is Alert, Appropriate, and Oriented Extremity - Neurologically intact ABD soft Neurovascular intact Sensation intact distally Intact pulses distally Dorsiflexion/Plantar flexion intact No cellulitis present Compartment soft Dressing - dressing C/D/I and no drainage Motor Function - intact, moving foot and toes well on exam.  Patient is able to straight leg raise as well as plantar and dorsiflex with good range of motion and strength.  Patient is neurovascularly intact down her left lower extremity to all dermatomes.  Posterior tibial pulses appreciated 2+ JP Drain pulled without difficulty.  Intact  Past Medical History:  Diagnosis Date   Acute left-sided low back pain with left-sided sciatica    Age-related osteoporosis without current pathological fracture    Anemia    Arthritis    hands, knees   Barrett's esophagus without dysplasia    Benign neoplasm of ascending colon    Cancer (HCC) 06/2007   right breast    Complication of anesthesia    bp dropped with demerol   DDD (degenerative disc disease), lumbar    Diverticulosis of large intestine without diverticulitis    Family history of adverse reaction to anesthesia    sister BP drops   Gallstones    GERD (gastroesophageal reflux disease)    History of hiatal hernia    Hyperlipidemia    Hypertension    Primary osteoarthritis of left hip    Pure hypercholesterolemia    Situational stress    Stage 3a chronic kidney disease (CKD) (HCC)    Stress incontinence in female    Vaginal prolapse     Assessment/Plan: 1 Day Post-Op Procedure(s) (LRB): TOTAL HIP ARTHROPLASTY (Left) Principal Problem:   Status post total hip replacement, left  Estimated body mass index is 31.18 kg/m as calculated from the following:   Height as of this encounter: 5\' 3"  (1.6 m).   Weight as of this encounter: 79.8 kg. Advance diet Up with therapy  Patient will continue to work with physical therapy to pass postoperative PT protocols, ROM and strengthening  Hip Preacutions  Discussed with the patient continuing to utilize ice over the bandage  Patient will wear TED hose  bilaterally to help prevent DVT and clot formation  Discussed the Aquacel bandage.  This bandage will stay in place 7 days postoperatively.  Can be replaced with honeycomb bandages that will be sent home with the patient  Discussed sending the patient home with tramadol and oxycodone for as needed pain management.  Patient will also be sent home with Celebrex to help with swelling and inflammation.  Patient will take an 81 mg aspirin twice daily for DVT  prophylaxis  JP drain removed without difficulty, intact  Weight-Bearing as tolerated to left leg  Patient will follow-up with [facility] clinic orthopedics in 6 weeks for re-imaging and reevaluation   Rayburn Go, PA-C [facility] 01/29/2023, 7:43 AM

## 2023-01-29 NOTE — Discharge Summary (Signed)
Physician Discharge Summary  Subjective: 1 Day Post-Op Procedure(s) (LRB): TOTAL HIP ARTHROPLASTY (Left) Patient reports pain as mild.   Patient seen in rounds with Dr. Ernest Pine. Patient is well, and has had no acute complaints or problems.  Denies having any CP, SOB, N/V, fevers or chills. We will continue with therapy today.  States that she was already up and able to move to her chair and ambulate around her room with PT yesterday.  And has done well with PT today. Plan is ready to go home  Physician Discharge Summary  Patient ID: Debbie Chavez MRN: [id number] DOB/AGE: 02-22-1951 71 y.o.  Admit date: 01/28/2023 Discharge date: 01/29/2023  Admission Diagnoses:  Discharge Diagnoses:  Principal Problem:   Status post total hip replacement, left   Discharged Condition: good  Hospital Course: Patient presented to the hospital on 01/28/2023 for an elective left total knee arthroplasty performed by Dr. Ernest Pine.  Patient was given 2 g of Ancef and 1 g of TXA perioperatively.  Patient tolerated the surgery well without any complications.  See operative details below.  Postop day 1, patient is not having any symptoms or issues.  States that her pain was well-controlled, just achy.  She was able to pass her PT protocols without issue.  Her drain was pulled and intact.  Patient's vital signs are stable.  Patient cleared for discharge  PROCEDURE:  Left total hip arthroplasty   SURGEON:  Jena Gauss. M.D.   ASSISTANT:  Gean Birchwood, PA-C (present and scrubbed throughout the case, critical for assistance with exposure, retraction, instrumentation, and closure)   ANESTHESIA: spinal   ESTIMATED BLOOD LOSS: 100 mL   FLUIDS REPLACED: 1400 mL of crystalloid   DRAINS: 2 medium Hemovac drains   IMPLANTS UTILIZED: DePuy size 6 standard offset Actis femoral stem, 50 mm OD Pinnacle 100 acetabular component, +4 mm 10 degree Pinnacle Marathon polyethylene insert, and a 32 mm CoCr +1 mm hip  ball  Treatments: None  Discharge Exam: Blood pressure (!) 110/56, pulse 67, temperature 98.6 F (37 C), temperature source Temporal, resp. rate 14, height 5\' 3"  (1.6 m), weight 79.8 kg, SpO2 98%.   Disposition: Home   Allergies as of 01/29/2023       Reactions   Lisinopril Hives   Meperidine Other (See Comments)   BP dropped quickly        Medication List     STOP taking these medications    ibuprofen 600 MG tablet Commonly known as: ADVIL       TAKE these medications    aspirin 81 MG chewable tablet Chew 1 tablet (81 mg total) by mouth 2 (two) times daily.   Calcium Carbonate-Vitamin D 500-125 MG-UNIT Tabs Take 1 tablet by mouth daily.   celecoxib 200 MG capsule Commonly known as: CELEBREX Take 1 capsule (200 mg total) by mouth 2 (two) times daily.   cyanocobalamin 1000 MCG tablet Commonly known as: VITAMIN B12 Take 1,000 mcg by mouth daily.   diphenhydramine-acetaminophen 25-500 MG Tabs tablet Commonly known as: TYLENOL PM Take 1 tablet by mouth at bedtime as needed.   Fish Oil 1200 MG Cpdr Take 1,200 mg by mouth daily.   gabapentin 100 MG capsule Commonly known as: NEURONTIN Take 100 mg by mouth at bedtime. And as needed   GNP Iron 200 (65 Fe) MG Tabs Generic drug: Ferrous Sulfate Dried   multivitamin tablet Take 2 tablets by mouth daily. Gummie   oxyCODONE 5 MG immediate release tablet Commonly known  as: Oxy IR/ROXICODONE Take 1 tablet (5 mg total) by mouth every 4 (four) hours as needed for moderate pain (pain score 4-6).   pantoprazole 20 MG tablet Commonly known as: PROTONIX Take 1 tablet (20 mg total) by mouth daily before breakfast.   traMADol 50 MG tablet Commonly known as: ULTRAM Take 1 tablet (50 mg total) by mouth every 6 (six) hours as needed. What changed:  how much to take when to take this reasons to take this   valsartan 40 MG tablet Commonly known as: DIOVAN Take 40 mg by mouth daily.   Vitamin D3 25 MCG (1000 UT)  Caps Take 1,000 Units by mouth daily.               Durable Medical Equipment  (From admission, onward)           Start     Ordered   01/28/23 1138  DME Walker rolling  Once       Question:  Patient needs a walker to treat with the following condition  Answer:  S/P total hip arthroplasty   01/28/23 1137   01/28/23 1138  DME Bedside commode  Once       Comments: Patient is not able to walk the distance required to go the bathroom, or he/she is unable to safely negotiate stairs required to access the bathroom.  A 3in1 BSC will alleviate this problem  Question:  Patient needs a bedside commode to treat with the following condition  Answer:  S/P total hip arthroplasty   01/28/23 1137            Follow-up Information     Hooten, Illene Labrador, MD Follow up on 03/12/2023.   Specialty: Orthopedic Surgery Why: at 3:45pm Contact information: [address] [facility] [place] [address] [phone]                 Signed: Gean Birchwood 01/29/2023, 8:20 AM   Objective: Vital signs in last 24 hours: Temp:  [96.8 F (36 C)-98.6 F (37 C)] 98.6 F (37 C) (08/06 0732) Pulse Rate:  [56-70] 67 (08/06 0732) Resp:  [11-20] 14 (08/06 0732) BP: (93-116)/(50-89) 110/56 (08/06 0732) SpO2:  [95 %-100 %] 98 % (08/06 0732)  Intake/Output from previous day:  Intake/Output Summary (Last 24 hours) at 01/29/2023 0820 Last data filed at 01/29/2023 0500 Gross per 24 hour  Intake 2176.39 ml  Output 360 ml  Net 1816.39 ml    Intake/Output this shift: No intake/output data recorded.  Labs: No results for input(s): "HGB" in the last 72 hours. No results for input(s): "WBC", "RBC", "HCT", "PLT" in the last 72 hours. No results for input(s): "NA", "K", "CL", "CO2", "BUN", "CREATININE", "GLUCOSE", "CALCIUM" in the last 72 hours. No results for input(s): "LABPT", "INR" in the last 72 hours.  EXAM: General - Patient is Alert, Appropriate, and Oriented Extremity -  Neurologically intact ABD soft Neurovascular intact Sensation intact distally Intact pulses distally Dorsiflexion/Plantar flexion intact No cellulitis present Compartment soft Dressing - dressing C/D/I and no drainage Motor Function - intact, moving foot and toes well on exam.  Patient is able to straight leg raise as well as plantar and dorsiflex with good range of motion and strength.  Patient is neurovascularly intact down her left lower extremity to all dermatomes.  Posterior tibial pulses appreciated 2+ JP Drain pulled without difficulty. Intact  Assessment/Plan: 1 Day Post-Op Procedure(s) (LRB): TOTAL HIP ARTHROPLASTY (Left) Procedure(s) (LRB): TOTAL HIP ARTHROPLASTY (Left) Past  Medical History:  Diagnosis Date   Acute left-sided low back pain with left-sided sciatica    Age-related osteoporosis without current pathological fracture    Anemia    Arthritis    hands, knees   Barrett's esophagus without dysplasia    Benign neoplasm of ascending colon    Cancer (HCC) 06/2007   right breast    Complication of anesthesia    bp dropped with demerol   DDD (degenerative disc disease), lumbar    Diverticulosis of large intestine without diverticulitis    Family history of adverse reaction to anesthesia    sister BP drops   Gallstones    GERD (gastroesophageal reflux disease)    History of hiatal hernia    Hyperlipidemia    Hypertension    Primary osteoarthritis of left hip    Pure hypercholesterolemia    Situational stress    Stage 3a chronic kidney disease (CKD) (HCC)    Stress incontinence in female    Vaginal prolapse    Principal Problem:   Status post total hip replacement, left  Estimated body mass index is 31.18 kg/m as calculated from the following:   Height as of this encounter: 5\' 3"  (1.6 m).   Weight as of this encounter: 79.8 kg.  Patient has tests or PT protocols.  Discussed transitioning to home health physical therapy and continuing to work on  ambulation, strength and ROM   Hip Preacutions discussed   Discussed with the patient continuing to utilize ice over the bandage   Patient will wear TED hose bilaterally to help prevent DVT and clot formation   Discussed the Aquacel bandage.  This bandage will stay in place 7 days postoperatively.  Can be replaced with honeycomb bandages that will be sent home with the patient   Discussed sending the patient home with tramadol and oxycodone for as needed pain management.  Patient will also be sent home with Celebrex to help with swelling and inflammation.  Patient will take an 81 mg aspirin twice daily for DVT prophylaxis   JP drain removed without difficulty, intact   Weight-Bearing as tolerated to left leg   Patient will follow-up with [facility]opedics in 6 weeks for re-imaging and reevaluation    Diet - Regular diet Follow up - in 6 weeks Activity - WBAT Disposition - Home Condition Upon Discharge - Good DVT Prophylaxis - Aspirin and TED hose  Danise Edge, PA-C Orthopaedic Surgery 01/29/2023, 8:20 AM

## 2023-01-29 NOTE — Plan of Care (Signed)
  Problem: Education: Goal: Knowledge of the prescribed therapeutic regimen will improve Outcome: Progressing   Problem: Activity: Goal: Ability to avoid complications of mobility impairment will improve Outcome: Progressing Goal: Ability to tolerate increased activity will improve Outcome: Progressing   Problem: Clinical Measurements: Goal: Postoperative complications will be avoided or minimized Outcome: Progressing   Problem: Pain Management: Goal: Pain level will decrease with appropriate interventions Outcome: Progressing   

## 2023-01-29 NOTE — Progress Notes (Signed)
Physical Therapy Treatment Patient Details Name: Debbie Chavez MRN: [id number] DOB: 04/01/1951 Today's Date: 01/29/2023   History of Present Illness Pt is a 72 y/o female s/p L THA on 01/28/2023 secondary to primary osteoarthritis of left hip. PMH includes: LBP w/ radiating symptoms, Anemia, Barrett's esophagus, breast cancer, HLD, vaginal prolapse, and L TKA 2021    PT Comments  Pt was pleasant and motivated to participate during the session and put forth good effort throughout. Initial session reading of 98% SpO2 and HR 74 bpm. Her pain at rest was 2-3/10. Pt able to perform STS at [facility] with supervision assist and no cues needed, compliant with posterior hip precautions throughout session. Pt ambulated 80 feet x 2 with seated rest break and stair navigation between bouts. Pt able to recall all posterior hip precautions by end of session, pain level of 4/10 after activity. Pt educated on appropriate car transfer instructions, pt verbalized understanding. Final session readings of 98% SpO2 and HR 88 bpm. Pt will benefit from continued PT services upon discharge to safely address deficits listed in patient problem list for decreased caregiver assistance and eventual return to PLOF.      If plan is discharge home, recommend the following: A little help with walking and/or transfers;A little help with bathing/dressing/bathroom;Assistance with cooking/housework;Assist for transportation;Help with stairs or ramp for entrance   Can travel by private vehicle        Equipment Recommendations  None recommended by PT    Recommendations for Other Services       Precautions / Restrictions Precautions Precautions: Posterior Hip;Fall Precaution Booklet Issued: Yes (comment) Precaution Comments: Pt able to recall all precautions with no cues from PT by end of session Restrictions Weight Bearing Restrictions: Yes LLE Weight Bearing: Weight bearing as tolerated     Mobility  Bed Mobility Overal bed  mobility:  (No bed mobility preformed, pt found sitting in chair at start of session.)                  Transfers Overall transfer level: Needs assistance Equipment used: Rolling walker (2 wheels) Transfers: Sit to/from Stand Sit to Stand: Supervision           General transfer comment: Good carry over from previous session    Ambulation/Gait Ambulation/Gait assistance: Min guard Gait Distance (Feet): 80 Feet x2 with rest break  Assistive device: Rolling walker (2 wheels) Gait Pattern/deviations: Step-through pattern, Decreased step length - right, Decreased step length - left Gait velocity: decreased     General Gait Details: Cautious initially, showing steady stride once out of room   Stairs Stairs: Yes Stairs assistance: Min guard Stair Management: Backwards ascending, forwards descending, Step to pattern, With walker Number of Stairs: 4 General stair comments: VC and set up assistance required, pt verbalized understanding of sequencing and directions given.   Wheelchair Mobility     Tilt Bed    Modified Rankin (Stroke Patients Only)       Balance Overall balance assessment: Needs assistance Sitting-balance support: No upper extremity supported, Feet supported Sitting balance-Leahy Scale: Good     Standing balance support: Reliant on assistive device for balance, Bilateral upper extremity supported Standing balance-Leahy Scale: Fair                              Cognition Arousal/Alertness: Awake/alert Behavior During Therapy: WFL for tasks assessed/performed Overall Cognitive Status: Within Functional Limits for tasks assessed  Exercises      General Comments        Pertinent Vitals/Pain Pain Assessment Pain Assessment: 0-10 Pain Score: 2  Pain Location: At surgical site/ L hip Pain Descriptors / Indicators: Aching Pain Intervention(s): Monitored during session,  Ice applied    Home Living Family/patient expects to be discharged to:: Private residence Living Arrangements: Alone Available Help at Discharge: Family;Available 24 hours/day Type of Home: House Home Access: Stairs to enter Entrance Stairs-Rails: None (No hand rail, but has stable surface for support if needed) Entrance Stairs-Number of Steps: 3   Home Layout: One level Home Equipment: Shower seat - built [employer] fundraiser (2 wheels);Cane - single point;BSC/3in1;Adaptive equipment Additional Comments: No grab bars installed, but pt voiced possibility for family to install grab bar if needed. Otherwise pt has stable surfaces she is able to utilize when needed.    Prior Function            PT Goals (current goals can now be found in the care plan section) Progress towards PT goals: Progressing toward goals    Frequency    BID      PT Plan Current plan remains appropriate    Co-evaluation              AM-PAC PT "6 Clicks" Mobility   Outcome Measure  Help needed turning from your back to your side while in a flat bed without using bedrails?: A Little Help needed moving from lying on your back to sitting on the side of a flat bed without using bedrails?: A Little Help needed moving to and from a bed to a chair (including a wheelchair)?: A Little Help needed standing up from a chair using your arms (e.g., wheelchair or bedside chair)?: A Little Help needed to walk in hospital room?: A Little Help needed climbing 3-5 steps with a railing? : A Little 6 Click Score: 18    End of Session Equipment Utilized During Treatment: Gait belt Activity Tolerance: Patient tolerated treatment well Patient left: in chair;with call bell/phone within reach Nurse Communication: Mobility status PT Visit Diagnosis: Muscle weakness (generalized) (M62.81);Other abnormalities of gait and mobility (R26.89);Unsteadiness on feet (R26.81)     Time: 8657-8469 PT Time Calculation (min)  (ACUTE ONLY): 30 min  Charges:                            Cecile Sheerer, SPT 01/29/23, 12:21 PM

## 2023-01-30 ENCOUNTER — Encounter: Payer: Self-pay | Admitting: Orthopedic Surgery

## 2023-07-24 ENCOUNTER — Telehealth: Payer: Self-pay | Admitting: Gastroenterology

## 2023-07-24 DIAGNOSIS — K2270 Barrett's esophagus without dysplasia: Secondary | ICD-10-CM

## 2023-07-24 DIAGNOSIS — K219 Gastro-esophageal reflux disease without esophagitis: Secondary | ICD-10-CM

## 2023-07-24 MED ORDER — PANTOPRAZOLE SODIUM 20 MG PO TBEC
20.0000 mg | DELAYED_RELEASE_TABLET | Freq: Every day | ORAL | 0 refills | Status: DC
Start: 2023-07-24 — End: 2023-10-28

## 2023-07-24 NOTE — Telephone Encounter (Signed)
Last office visit 05/21/2022 History of colon polyps chronic gerd  Has appointment 09/17/2023  Last refill 05/21/2022 90 4 refills

## 2023-07-24 NOTE — Addendum Note (Signed)
Addended by: Radene Knee L on: 07/24/2023 10:18 AM   Modules accepted: Orders

## 2023-07-24 NOTE — Telephone Encounter (Signed)
The patient called in to get a medication refill for (Protonix) 20 MG and her pharmacy is [facility].

## 2023-09-17 ENCOUNTER — Ambulatory Visit (INDEPENDENT_AMBULATORY_CARE_PROVIDER_SITE_OTHER): Payer: Medicare Other | Admitting: Gastroenterology

## 2023-09-17 ENCOUNTER — Encounter: Payer: Self-pay | Admitting: Gastroenterology

## 2023-09-17 VITALS — BP 119/75 | HR 86 | Temp 97.6°F | Ht 63.0 in | Wt 173.4 lb

## 2023-09-17 DIAGNOSIS — K2270 Barrett's esophagus without dysplasia: Secondary | ICD-10-CM

## 2023-09-17 DIAGNOSIS — Z860100 Personal history of colon polyps, unspecified: Secondary | ICD-10-CM | POA: Diagnosis not present

## 2023-09-17 DIAGNOSIS — K219 Gastro-esophageal reflux disease without esophagitis: Secondary | ICD-10-CM

## 2023-09-17 NOTE — Progress Notes (Signed)
 Debbie Repress, MD [address]  [address]  [place], [place] [address]  Main: [phone]  Fax: [phone]    Gastroenterology Consultation  Referring Provider:     Marina Goodell, MD Primary Care Physician:  Marina Goodell, MD Primary Gastroenterologist:  Dr. Lannette Donath Reason for Consultation:   Follow-up of Barrett's esophagus, chronic GERD        HPI:   Debbie Chavez is a 73 y.o. female referred by Dr. Maryjane Hurter, Madaline Guthrie, MD  for consultation & management of recent exacerbation of heartburn.  Patient reports that she has been experiencing nocturnal heartburn lately.  She reports that she eats around 6 PM and goes to bed 3 to 4 hours later.  She is currently on Protonix 20 mg daily for more than 1 year.  She was previously on Protonix 40 mg once a day prior to the diagnosis of osteoporosis.  She would like to know if she needs further investigation.  Follow-up visit 05/21/2022 Patient is here for follow-up of chronic GERD.  Her symptoms are under control on Protonix 20 mg daily.  Follow-up visit 09/17/2023 Ms Genson is here for follow-up of short segment Barrett's esophagus.  Her symptoms of acid reflux are currently controlled on Protonix 20 mg daily.  She does not have any concerns today  NSAIDs: None  Antiplts/Anticoagulants/Anti thrombotics: None  GI Procedures: EGD and colonoscopy 09/18/2022 - Normal duodenal bulb and second portion of the duodenum. - Normal stomach. - Medium- sized hiatal hernia. - Esophageal mucosal changes secondary to established short- segment Barrett' s disease. Biopsied.  - Two 3 to 5 mm polyps in the transverse colon, removed with a cold snare. Resected and retrieved. - Diverticulosis in the entire examined colon. - The distal rectum and anal verge are normal on retroflexion view.  DIAGNOSIS:  A. ESOPHAGUS; COLD BIOPSY:  - SQUAMOCOLUMNAR MUCOSA WITH INTESTINAL METAPLASIA, COMPATIBLE WITH  PATIENT HISTORY OF BARRETT'S  ESOPHAGUS.  - BACKGROUND CHANGES OF REFLUX GASTROESOPHAGITIS.  - NEGATIVE FOR DYSPLASIA AND MALIGNANCY.   B. COLON POLYPS X 2, TRANSVERSE; COLD SNARE:  - FRAGMENTS (X 2) OF TUBULAR ADENOMAS.  - NEGATIVE FOR HIGH-GRADE DYSPLASIA AND MALIGNANCY.    EGD and colonoscopy 08/20/2017 - Esophageal mucosal changes suspicious for short-segment Barrett's esophagus. Biopsied. - Erythematous mucosa in the antrum. Biopsied. - 1 cm hiatal hernia. - Normal examined duodenum. Biopsied. DIAGNOSIS: A. ESOPHAGUS, 35 CM; COLD BIOPSY: - GASTRIC OXYNTIC MUCOSA WITH MILD CHRONIC GASTRITIS. - GASTRIC CARDIAC TYPE MUCOSA WITH MILD CHRONIC GASTRITIS AND REACTIVE FOVEOLAR HYPERPLASIA, CONSISTENT WITH REFLUX. - SQUAMOUS MUCOSA IS NOT PRESENT FOR EVALUATION. - NEGATIVE FOR INTESTINAL METAPLASIA, H. PYLORI, DYSPLASIA, AND MALIGNANCY.  B.  ESOPHAGUS, 34 CM; COLD BIOPSY: - BARRETT'S ESOPHAGUS. - REFLUX GASTROESOPHAGITIS. - NEGATIVE FOR DYSPLASIA AND MALIGNANCY.  C.  ESOPHAGUS, 33 CM; COLD BIOPSY: - BARRETT'S ESOPHAGUS. - NEGATIVE FOR DYSPLASIA AND MALIGNANCY.  D.  ESOPHAGUS, 32 CM; COLD BIOPSY: - BARRETT'S ESOPHAGUS. - NEGATIVE FOR DYSPLASIA AND MALIGNANCY.   - Thickened folds of the mucosa at the ileocecal valve. Biopsied. - Two 3 to 4 mm polyps in the ascending colon, removed with a cold biopsy forceps. Resected and retrieved. - Diverticulosis in the sigmoid colon. There was no evidence of diverticular bleeding. - The examination was otherwise normal. - The rectum, sigmoid colon, descending colon, transverse colon, ascending colon and cecum are normal.    Upper endoscopy 09/14/2019 - Esophageal mucosal changes classified as Barrett's stage C2-M3 per Prague criteria. Biopsied. -  Small hiatal hernia. - Normal stomach. - Normal examined duodenum. DIAGNOSIS:  A. ESOPHAGUS, 34 CM; COLD BIOPSY:  - COLUMNAR MUCOSA WITH FEATURES OF MILD REFLUX.  - NEGATIVE FOR INTESTINAL METAPLASIA, DYSPLASIA, AND  MALIGNANCY.   B. ESOPHAGUS, 32 CM; COLD BIOPSY:  - BARRETT'S ESOPHAGUS.  - NEGATIVE FOR DYSPLASIA AND MALIGNANCY.   C. ESOPHAGUS, 31 CM; COLD BIOPSY:  - BARRETT'S ESOPHAGUS.  - NEGATIVE FOR DYSPLASIA AND MALIGNANCY.   D. ESOPHAGUS, 30 CM; COLD BIOPSY:  - BARRETT'S ESOPHAGUS.  - NEGATIVE FOR DYSPLASIA AND MALIGNANCY.     Past Medical History:  Diagnosis Date   Acute left-sided low back pain with left-sided sciatica    Age-related osteoporosis without current pathological fracture    Anemia    Arthritis    hands, knees   Barrett's esophagus without dysplasia    Benign neoplasm of ascending colon    Cancer (HCC) 06/2007   right breast    Complication of anesthesia    bp dropped with demerol   DDD (degenerative disc disease), lumbar    Diverticulosis of large intestine without diverticulitis    Family history of adverse reaction to anesthesia    sister BP drops   Gallstones    GERD (gastroesophageal reflux disease)    History of hiatal hernia    Hyperlipidemia    Hypertension    Primary osteoarthritis of left hip    Pure hypercholesterolemia    Situational stress    Stage 3a chronic kidney disease (CKD) (HCC)    Stress incontinence in female    Vaginal prolapse     Past Surgical History:  Procedure Laterality Date   BREAST SURGERY Right 2007   lumpectomy   CATARACT EXTRACTION W/PHACO Left 01/08/2022   Procedure: CATARACT EXTRACTION PHACO AND INTRAOCULAR LENS PLACEMENT (IOC) LEFT;  Surgeon: Nevada Crane, MD;  Location: [facility];  Service: Ophthalmology;  Laterality: Left;  3.33 00:25.8   CATARACT EXTRACTION W/PHACO Right 01/22/2022   Procedure: CATARACT EXTRACTION PHACO AND INTRAOCULAR LENS PLACEMENT (IOC) RIGHT;  Surgeon: Nevada Crane, MD;  Location: [facility];  Service: Ophthalmology;  Laterality: Right;  3.58 00:26.9   COLONOSCOPY     COLONOSCOPY WITH PROPOFOL N/A 08/20/2017   Procedure: COLONOSCOPY WITH PROPOFOL;  Surgeon:  Pasty Spillers, MD;  Location: [facility];  Service: Endoscopy;  Laterality: N/A;   COLONOSCOPY WITH PROPOFOL N/A 09/18/2022   Procedure: COLONOSCOPY WITH PROPOFOL;  Surgeon: Toney Reil, MD;  Location: [facility];  Service: Endoscopy;  Laterality: N/A;   COLPOPEXY FOR SUSPENSION UTEROSACRUM INTRAPERITONEAL N/A 02/04/2020     COLPORRHAPHY FOR REPAIR CYSTOCELE ANTERIOR N/A 02/04/2020     CYSTOURETHROSCOPY     ESOPHAGOGASTRODUODENOSCOPY (EGD) WITH PROPOFOL N/A 08/20/2017   Procedure: ESOPHAGOGASTRODUODENOSCOPY (EGD) WITH PROPOFOL;  Surgeon: Pasty Spillers, MD;  Location: [facility];  Service: Endoscopy;  Laterality: N/A;   ESOPHAGOGASTRODUODENOSCOPY (EGD) WITH PROPOFOL N/A 02/12/2018   Procedure: ESOPHAGOGASTRODUODENOSCOPY (EGD) WITH PROPOFOL;  Surgeon: Pasty Spillers, MD;  Location: [facility];  Service: Endoscopy;  Laterality: N/A;   ESOPHAGOGASTRODUODENOSCOPY (EGD) WITH PROPOFOL N/A 09/14/2019   Procedure: ESOPHAGOGASTRODUODENOSCOPY (EGD) WITH BIOPSY;  Surgeon: Midge Minium, MD;  Location: [facility];  Service: Endoscopy;  Laterality: N/A;   ESOPHAGOGASTRODUODENOSCOPY (EGD) WITH PROPOFOL N/A 09/18/2022   Procedure: ESOPHAGOGASTRODUODENOSCOPY (EGD) WITH PROPOFOL;  Surgeon: Toney Reil, MD;  Location: [facility];  Service: Endoscopy;  Laterality: N/A;   JOINT REPLACEMENT     KNEE ARTHROPLASTY Left 12/14/2019  Procedure: COMPUTER ASSISTED TOTAL KNEE ARTHROPLASTY;  Surgeon: Donato Heinz, MD;  Location: [facility];  Service: Orthopedics;  Laterality: Left;   MASTECTOMY Bilateral 06/2007   no lumph nodes removed   POLYPECTOMY  08/20/2017   Procedure: POLYPECTOMY;  Surgeon: Pasty Spillers, MD;  Location: [facility];  Service: Endoscopy;;   POLYPECTOMY  09/18/2022   Procedure: POLYPECTOMY;  Surgeon: Toney Reil, MD;  Location: [facility];  Service: Endoscopy;;   shoulder Left    TOTAL  HIP ARTHROPLASTY Left 01/28/2023   Procedure: TOTAL HIP ARTHROPLASTY;  Surgeon: Donato Heinz, MD;  Location: [facility];  Service: Orthopedics;  Laterality: Left;   TUBAL LIGATION     VAGINAL HYSTERECTOMY     Current Outpatient Medications:    aspirin 81 MG chewable tablet, Chew 1 tablet (81 mg total) by mouth 2 (two) times daily., Disp: , Rfl:    Calcium Carbonate-Vitamin D 500-125 MG-UNIT TABS, Take 1 tablet by mouth daily. , Disp: , Rfl:    Cholecalciferol (VITAMIN D3) 25 MCG (1000 UT) CAPS, Take 1,000 Units by mouth daily., Disp: , Rfl:    diphenhydramine-acetaminophen (TYLENOL PM) 25-500 MG TABS tablet, Take 1 tablet by mouth at bedtime as needed., Disp: , Rfl:    GNP IRON 200 (65 Fe) MG TABS, , Disp: , Rfl:    Multiple Vitamin (MULTIVITAMIN) tablet, Take 2 tablets by mouth daily. Gummie, Disp: , Rfl:    Omega-3 Fatty Acids (FISH OIL) 1200 MG CPDR, Take 1,200 mg by mouth daily. , Disp: , Rfl:    pantoprazole (PROTONIX) 20 MG tablet, Take 1 tablet (20 mg total) by mouth daily before breakfast., Disp: 90 tablet, Rfl: 0   traMADol (ULTRAM) 50 MG tablet, Take 1 tablet (50 mg total) by mouth every 6 (six) hours as needed., Disp: 30 tablet, Rfl: 0   valsartan (DIOVAN) 40 MG tablet, Take 40 mg by mouth daily., Disp: , Rfl:    vitamin B-12 (CYANOCOBALAMIN) 1000 MCG tablet, Take 1,000 mcg by mouth daily., Disp: , Rfl:   Family History  Problem Relation Age of Onset   Cancer Mother        breast   Cancer Sister        breast     Social History   Tobacco Use   Smoking status: Never   Smokeless tobacco: Never  Vaping Use   Vaping status: Never Used  Substance Use Topics   Alcohol use: Not Currently    Comment: rare - Holidays   Drug use: No    Allergies as of 09/17/2023 - Review Complete 09/17/2023  Allergen Reaction Noted   Lisinopril Hives 09/08/2019   Meperidine Other (See Comments) 07/09/2017    Review of Systems:    All systems reviewed and negative except where noted in  HPI.   Physical Exam:  BP 119/75 (BP Location: Left Arm, Patient Position: Sitting, Cuff Size: Normal)   Pulse 86   Temp 97.6 F (36.4 C) (Oral)   Ht 5\' 3"  (1.6 m)   Wt 173 lb 6 oz (78.6 kg)   BMI 30.71 kg/m  No LMP recorded. Patient is postmenopausal.  General:   Alert,  Well-developed, well-nourished, pleasant and cooperative in NAD Head:  Normocephalic and atraumatic. Eyes:  Sclera clear, no icterus.   Conjunctiva pink. Ears:  Normal auditory acuity. Nose:  No deformity, discharge, or lesions. Mouth:  No deformity or lesions,oropharynx pink & moist. Neck:  Supple; no masses or thyromegaly. Lungs:  Respirations even  and unlabored.  Clear throughout to auscultation.   No wheezes, crackles, or rhonchi. No acute distress. Heart:  Regular rate and rhythm; no murmurs, clicks, rubs, or gallops. Abdomen:  Normal bowel sounds. Soft, non-tender and non-distended without masses, hepatosplenomegaly or hernias noted.  No guarding or rebound tenderness.   Rectal: Not performed Msk:  Symmetrical without gross deformities. Good, equal movement & strength bilaterally. Pulses:  Normal pulses noted. Extremities:  No clubbing or edema.  No cyanosis. Neurologic:  Alert and oriented x3;  grossly normal neurologically. Skin:  Intact without significant lesions or rashes. No jaundice. Psych:  Alert and cooperative. Normal mood and affect.  Imaging Studies: Reviewed  Assessment and Plan:   CLYTEE HEINRICH is a 73 y.o. pleasant Caucasian female with history of short segment Barrett's esophagus without dysplasia, chronic GERD, currently maintained on low-dose Protonix 20 mg daily  Recommend surveillance EGD for short segment Barrett's esophagus in 5 years from 08/2022 Continue Protonix 20 mg daily long-term Recommend surveillance colonoscopy given history of colon polyps in 5 years from 08/2022  Follow up as needed   Debbie Repress, MD

## 2023-10-28 ENCOUNTER — Telehealth: Payer: Self-pay | Admitting: Gastroenterology

## 2023-10-28 DIAGNOSIS — K2270 Barrett's esophagus without dysplasia: Secondary | ICD-10-CM

## 2023-10-28 DIAGNOSIS — K219 Gastro-esophageal reflux disease without esophagitis: Secondary | ICD-10-CM

## 2023-10-28 MED ORDER — PANTOPRAZOLE SODIUM 20 MG PO TBEC
20.0000 mg | DELAYED_RELEASE_TABLET | Freq: Every day | ORAL | 2 refills | Status: AC
Start: 2023-10-28 — End: ?

## 2023-10-28 NOTE — Telephone Encounter (Signed)
 Pt requesting call back for refill she did not leave medication information

## 2023-10-28 NOTE — Addendum Note (Signed)
 Addended by: Conny Del L on: 10/28/2023 10:48 AM   Modules accepted: Orders

## 2023-10-28 NOTE — Telephone Encounter (Signed)
 Patient left a message on my voicemail stating she needed a refill on the pantoprazole   Last office visit 09/17/2023 Short Segment Baarrett's Esophagus  Plan: Recommend surveillance EGD for short segment Barrett's esophagus in 5 years from 08/2022 Continue Protonix  20 mg daily long-term Recommend surveillance colonoscopy given history of colon polyps in 5 years from 08/2022 Last refill 07/24/2023 90 0 refills   Called and informed patient medication was sent to the pharmacy.

## 2023-10-28 NOTE — Telephone Encounter (Signed)
 Called and left a message for call back to find out what medication patient needs refill

## 2023-11-26 ENCOUNTER — Other Ambulatory Visit: Payer: Self-pay | Admitting: Otolaryngology

## 2023-11-26 DIAGNOSIS — H903 Sensorineural hearing loss, bilateral: Secondary | ICD-10-CM

## 2023-12-09 ENCOUNTER — Ambulatory Visit
Admission: RE | Admit: 2023-12-09 | Discharge: 2023-12-09 | Disposition: A | Discharge status: Home / Self Care | Source: Ambulatory Visit | Attending: Otolaryngology | Admitting: Otolaryngology

## 2023-12-09 ENCOUNTER — Other Ambulatory Visit: Payer: Self-pay | Admitting: Otolaryngology

## 2023-12-09 DIAGNOSIS — H903 Sensorineural hearing loss, bilateral: Secondary | ICD-10-CM

## 2023-12-09 MED ORDER — GADOPICLENOL 0.5 MMOL/ML IV SOLN
10.0000 mL | Freq: Once | INTRAVENOUS | Status: AC | PRN
  Administered 2023-12-09: 8 mL via INTRAVENOUS
